# Patient Record
Sex: Female | Born: 1965 | Race: White | Hispanic: No | Marital: Married | Smoking: Never smoker
Health system: Southern US, Community
[De-identification: ages and names within clinical notes are randomized; demographics above are authoritative.]

## PROBLEM LIST (undated history)

## (undated) DIAGNOSIS — G43909 Migraine, unspecified, not intractable, without status migrainosus: Secondary | ICD-10-CM

## (undated) HISTORY — PX: TUBAL LIGATION: SHX77

## (undated) HISTORY — PX: BREAST EXCISIONAL BIOPSY: SUR124

## (undated) HISTORY — PX: BUNIONECTOMY: SHX129

---

## 1998-02-13 ENCOUNTER — Other Ambulatory Visit: Admission: RE | Admit: 1998-02-13 | Discharge: 1998-02-13 | Payer: Self-pay | Admitting: *Deleted

## 1998-10-12 ENCOUNTER — Other Ambulatory Visit: Admission: RE | Admit: 1998-10-12 | Discharge: 1998-10-12 | Payer: Self-pay | Admitting: *Deleted

## 1999-01-01 ENCOUNTER — Inpatient Hospital Stay (HOSPITAL_COMMUNITY): Admission: AD | Admit: 1999-01-01 | Discharge: 1999-01-25 | Payer: Self-pay | Admitting: Obstetrics and Gynecology

## 1999-01-02 ENCOUNTER — Encounter: Payer: Self-pay | Admitting: Obstetrics and Gynecology

## 1999-01-09 ENCOUNTER — Encounter: Payer: Self-pay | Admitting: Obstetrics and Gynecology

## 1999-01-14 ENCOUNTER — Encounter: Payer: Self-pay | Admitting: Obstetrics and Gynecology

## 1999-01-16 ENCOUNTER — Encounter: Payer: Self-pay | Admitting: Obstetrics and Gynecology

## 1999-01-26 ENCOUNTER — Encounter (HOSPITAL_COMMUNITY): Admission: RE | Admit: 1999-01-26 | Discharge: 1999-04-26 | Payer: Self-pay | Admitting: *Deleted

## 1999-03-13 ENCOUNTER — Other Ambulatory Visit: Admission: RE | Admit: 1999-03-13 | Discharge: 1999-03-13 | Payer: Self-pay | Admitting: *Deleted

## 1999-04-29 ENCOUNTER — Encounter (HOSPITAL_COMMUNITY): Admission: RE | Admit: 1999-04-29 | Discharge: 1999-07-28 | Payer: Self-pay | Admitting: *Deleted

## 1999-07-31 ENCOUNTER — Encounter: Admission: RE | Admit: 1999-07-31 | Discharge: 1999-10-29 | Payer: Self-pay | Admitting: *Deleted

## 1999-11-28 ENCOUNTER — Encounter: Admission: RE | Admit: 1999-11-28 | Discharge: 1999-12-27 | Payer: Self-pay | Admitting: *Deleted

## 2000-04-15 ENCOUNTER — Other Ambulatory Visit: Admission: RE | Admit: 2000-04-15 | Discharge: 2000-04-15 | Payer: Self-pay | Admitting: *Deleted

## 2001-05-04 ENCOUNTER — Other Ambulatory Visit: Admission: RE | Admit: 2001-05-04 | Discharge: 2001-05-04 | Payer: Self-pay | Admitting: *Deleted

## 2002-08-01 ENCOUNTER — Other Ambulatory Visit: Admission: RE | Admit: 2002-08-01 | Discharge: 2002-08-01 | Payer: Self-pay | Admitting: *Deleted

## 2003-10-05 ENCOUNTER — Other Ambulatory Visit: Admission: RE | Admit: 2003-10-05 | Discharge: 2003-10-05 | Payer: Self-pay | Admitting: *Deleted

## 2004-06-19 ENCOUNTER — Encounter: Admission: RE | Admit: 2004-06-19 | Discharge: 2004-06-19 | Payer: Self-pay | Admitting: Family Medicine

## 2004-06-26 ENCOUNTER — Encounter: Admission: RE | Admit: 2004-06-26 | Discharge: 2004-07-16 | Payer: Self-pay | Admitting: Family Medicine

## 2008-09-29 ENCOUNTER — Emergency Department (HOSPITAL_COMMUNITY): Admission: EM | Admit: 2008-09-29 | Discharge: 2008-09-29 | Payer: Self-pay | Admitting: Emergency Medicine

## 2008-10-31 ENCOUNTER — Ambulatory Visit: Payer: Self-pay | Admitting: Obstetrics and Gynecology

## 2008-10-31 ENCOUNTER — Inpatient Hospital Stay (HOSPITAL_COMMUNITY): Admission: AD | Admit: 2008-10-31 | Discharge: 2008-10-31 | Payer: Self-pay | Admitting: Obstetrics and Gynecology

## 2009-02-22 ENCOUNTER — Encounter (INDEPENDENT_AMBULATORY_CARE_PROVIDER_SITE_OTHER): Payer: Self-pay | Admitting: Obstetrics and Gynecology

## 2009-02-22 ENCOUNTER — Inpatient Hospital Stay (HOSPITAL_COMMUNITY): Admission: AD | Admit: 2009-02-22 | Discharge: 2009-02-24 | Payer: Self-pay | Admitting: Obstetrics and Gynecology

## 2010-10-12 LAB — CBC
HCT: 26.5 % — ABNORMAL LOW (ref 36.0–46.0)
HCT: 36.3 % (ref 36.0–46.0)
Hemoglobin: 8.4 g/dL — ABNORMAL LOW (ref 12.0–15.0)
Hemoglobin: 8.9 g/dL — ABNORMAL LOW (ref 12.0–15.0)
MCHC: 33.6 g/dL (ref 30.0–36.0)
MCV: 101.5 fL — ABNORMAL HIGH (ref 78.0–100.0)
Platelets: 125 10*3/uL — ABNORMAL LOW (ref 150–400)
RBC: 2.43 MIL/uL — ABNORMAL LOW (ref 3.87–5.11)
RBC: 2.61 MIL/uL — ABNORMAL LOW (ref 3.87–5.11)
RDW: 12.7 % (ref 11.5–15.5)
WBC: 10.3 10*3/uL (ref 4.0–10.5)
WBC: 12.1 10*3/uL — ABNORMAL HIGH (ref 4.0–10.5)
WBC: 14.4 10*3/uL — ABNORMAL HIGH (ref 4.0–10.5)

## 2010-10-16 LAB — CBC
HCT: 33.5 % — ABNORMAL LOW (ref 36.0–46.0)
Hemoglobin: 11.9 g/dL — ABNORMAL LOW (ref 12.0–15.0)
MCV: 95.1 fL (ref 78.0–100.0)
Platelets: 150 10*3/uL (ref 150–400)
RBC: 3.52 MIL/uL — ABNORMAL LOW (ref 3.87–5.11)
WBC: 10.3 10*3/uL (ref 4.0–10.5)

## 2010-10-16 LAB — WET PREP, GENITAL

## 2010-10-17 LAB — CBC
HCT: 40.5 % (ref 36.0–46.0)
Hemoglobin: 13.6 g/dL (ref 12.0–15.0)
MCHC: 33.6 g/dL (ref 30.0–36.0)
RBC: 4.37 MIL/uL (ref 3.87–5.11)

## 2010-10-17 LAB — DIFFERENTIAL
Basophils Relative: 2 % — ABNORMAL HIGH (ref 0–1)
Eosinophils Relative: 0 % (ref 0–5)
Lymphocytes Relative: 12 % (ref 12–46)
Monocytes Absolute: 0.4 10*3/uL (ref 0.1–1.0)
Monocytes Relative: 3 % (ref 3–12)
Neutro Abs: 11.1 10*3/uL — ABNORMAL HIGH (ref 1.7–7.7)

## 2010-10-17 LAB — URINALYSIS, ROUTINE W REFLEX MICROSCOPIC
Bilirubin Urine: NEGATIVE
Glucose, UA: NEGATIVE mg/dL
Hgb urine dipstick: NEGATIVE
Nitrite: NEGATIVE
Specific Gravity, Urine: 1.017 (ref 1.005–1.030)
pH: 7 (ref 5.0–8.0)

## 2010-11-19 NOTE — Op Note (Signed)
NAME:  Amanda Dudley, Amanda Dudley                  ACCOUNT NO.:  [id number]   MEDICAL RECORD NO.:  [id number]          PATIENT TYPE:  INP   LOCATION:  9117                          FACILITY:  [facility]   PHYSICIAN:  Duke Salvia. Marcelle Overlie, M.D.DATE OF BIRTH:  01-06-1966   DATE OF PROCEDURE:  02/22/2009  DATE OF DISCHARGE:                               OPERATIVE REPORT   PREOPERATIVE DIAGNOSES:  1. Term intrauterine pregnancy.  2. Previous cesarean section.  3. Occiput posterior presentation.  4. Failure to descend.  5. Request permanent sterilization.   POSTOPERATIVE DIAGNOSES:  1. Term intrauterine pregnancy.  2. Previous cesarean section.  3. Occiput posterior presentation.  4. Failure to descend.  5. Request permanent sterilization.   PROCEDURE:  Repeat low transverse cesarean section.   SURGEON:  Duke Salvia. Marcelle Overlie, MD   ANESTHESIA:  Spinal.   COMPLICATIONS:  None.   DRAINS:  Foley catheter.   ESTIMATED BLOOD LOSS:  1200 mL.   INDICATIONS:  This patient has had successful VBAC after cesarean  section years ago, low-transverse for CPD apparently due to persistent  OP presentation.  She achieved complete dilatation and despite pushing  for approximately an hour, had failure to descend, no regional  anesthesia and a lot of pain at that point and after discussion decided  to proceed with repeat cesarean section and tubal ligation.   The patient was taken to the operating room after an adequate level of  spinal anesthetic was obtained.  With the patient in left tilt position,  the abdomen prepped and draped in usual manner for sterile abdominal  procedure.  Foley catheter positioned draining clear urine.  Transverse  incision was made, we avoided her prior midline incision.  This was  carried down to the fascia which was incised and extended transversely.  Rectus muscle divided in midline.  Peritoneum entered superiorly without  incident and extended vertically.  The vesicouterine serosa was  incised  and the bladder was sharply and bluntly dissected below, bladder blade  repositioned.  Transverse incision made in lower segment, extended with  blunt dissection.  Clear fluid noted.  The fetus was presenting straight  OP.  She was then delivered of a female Apgars 9 and 9, pH 7.38, 6 pounds  9 ounces.  The infant suctioned, cord clamped and passed to pediatric  team for further care.  Placenta was then delivered manually intact, was  sent to pathology.  The uterus was firm, but too large to be easily  delivered through the incision, was left in the abdomen.  The cavity was  wiped clean with laparotomy pack.  Closure obtained with first layer of  0 chromic in a locked fashion followed by an imbricating layer of 0  chromic.  This was hemostatic.  The bladder flap area was intact and  hemostatic.  Tubes and ovaries were inspected and noted to be normal.  Filshie clip was then applied across each tube in the right angle, 2-3  cm from the cornu on each side with excellent application, these areas  were hemostatic.  Prior to closure, the sponge, needle,  and instrument  counts reported as correct x2.  Peritoneum closed with a 3-0 Vicryl  running suture.  The rectus muscles reapproximated in the midline with 3-  0 Vicryl interrupted sutures.  A 0 PDS used to close the fascia from  laterally to midline on either side.  Subcutaneous tissue was hemostatic  by 4-0 Monocryl subcuticular closure with pressure dressing.  Clear  urine noted.  During the case, she did receive Ancef 1 g IV as  dissection was getting started, had been on penicillin protocol during  labor for history of positive GBS.  Mother and baby doing well at that  point.      Richard M. Marcelle Overlie, M.D.  Electronically Signed     [facility]/MEDQ  D:  02/22/2009  T:  02/23/2009  Job:  [id number]

## 2010-11-22 NOTE — Discharge Summary (Signed)
[facility]  Patient:    Amanda Dudley                          MRN: [id number] Adm. Date:  [id number] Disc. Date: 01/25/99 Attending:  Trevor Iha Dictator:   Kathi Der, RN                           Discharge Summary  ADMISSION DIAGNOSES:          1. Intrauterine pregnancy at 21-1/[redacted] weeks gestation.                               2. Preterm labor with preterm cervical changes.  DISCHARGE DIAGNOSES:          1. Intrauterine pregnancy at 24+ weeks gestation.                               2. Spontaneous vaginal delivery of a viable female                                  infant with Apgars of 6 at one minute and 7 at                                  five minutes over an intact perineum.  PROCEDURE:                    On January 23, 1999, at 0417 a spontaneous vaginal delivery of a viable female infant with Apgars of 6 at one minute and 7 at five  minutes.  Baby to NICU.  REASON FOR ADMISSION:         The patient is a 45 year old, gravida 5, para 4, t approximately 21-1/[redacted] weeks gestation at the time of admission who was admitted ith bloody, mucousy discharge.  She had had some questionable preterm labor in the ast one to two weeks and was given Terbutaline p.o. to take as needed.  The patient  stated that she could not feel any contractions.  The patient was evaluated in triage and was found to have a bulging membranes with no contractions.  The patient does have a history of prior cesarean section and VBAC x 3.  Her estimated date of confinement is May 13, 1999, which has been confirmed by ultrasound.  HOSPITAL COURSE:              The patient was admitted and placed in Trendelenburg position.  She received magnesium sulfate and IV Unasyn for Group B Strep prophylaxis.  At the time of admission, the cervix was 1 to 2 cm, approximately 50% effaced, with a bulging bag.  An ultrasound done on June 28, showed no measurable cervix with  bulging membranes and therefore we were unable to schedule cerclage. The patient was maintained on magnesium sulfate and bed rest and did receive betamethasone during her hospitalization.  The patient remained stable and on July 11, the patient started having some increased uterine activity with bleeding. Magnesium sulfate was increased.  An ultrasound and CBC were ordered and on July 12, this did show an AFI of 7.1, no measurable cervix, and  a probable small marginal placental abruption.  Hemoglobin at this time was 11.1 and white blood  cell count 15.2.  The patient remained stable on magnesium and Terbutaline and Unasyn and on January 22, 1999, the patient once again had increased vaginal bleeding. Vaginal examination did show the cervix to be complete with the presenting part at +3.  The patient progressed and delivered a viable female infant over an intact  perineum.  This was a spontaneous vaginal delivery.  The baby had Apgars of 6 at one minute and 7 at five minutes and the baby was taken to NICU.  Postpartum, the patient does well.  On postpartum day #1, hemoglobin was 12.1, hematocrit 35.5, and white blood cell count 12.0.  The patient was discharged home on postpartum day #2 in good condition.  She is to follow up in the office in four to six weeks for er postpartum visit.  DISCHARGE MEDICATIONS:        1. Prenatal vitamins one p.o. q.d.                               2. Ibuprofen p.r.n.                               3. Vicodin #20 one to two p.o. q.6h. p.r.n.  DISCHARGE INSTRUCTIONS:       Per postpartum instruction booklet. DD:  04/11/99 TD:  04/11/99 Job: [id number]

## 2011-10-16 ENCOUNTER — Other Ambulatory Visit: Payer: Self-pay | Admitting: Obstetrics and Gynecology

## 2011-10-16 DIAGNOSIS — N6320 Unspecified lump in the left breast, unspecified quadrant: Secondary | ICD-10-CM

## 2011-10-21 ENCOUNTER — Ambulatory Visit
Admission: RE | Admit: 2011-10-21 | Discharge: 2011-10-21 | Disposition: A | Discharge status: Home / Self Care | Payer: BC Managed Care – PPO | Source: Ambulatory Visit | Attending: Obstetrics and Gynecology | Admitting: Obstetrics and Gynecology

## 2011-10-21 DIAGNOSIS — N6320 Unspecified lump in the left breast, unspecified quadrant: Secondary | ICD-10-CM

## 2012-12-21 ENCOUNTER — Emergency Department (HOSPITAL_BASED_OUTPATIENT_CLINIC_OR_DEPARTMENT_OTHER): Payer: Worker's Compensation

## 2012-12-21 ENCOUNTER — Emergency Department (HOSPITAL_BASED_OUTPATIENT_CLINIC_OR_DEPARTMENT_OTHER)
Admission: EM | Admit: 2012-12-21 | Discharge: 2012-12-21 | Disposition: A | Discharge status: Home / Self Care | Payer: Worker's Compensation | Attending: Emergency Medicine | Admitting: Emergency Medicine

## 2012-12-21 ENCOUNTER — Encounter (HOSPITAL_BASED_OUTPATIENT_CLINIC_OR_DEPARTMENT_OTHER): Payer: Self-pay

## 2012-12-21 DIAGNOSIS — IMO0002 Reserved for concepts with insufficient information to code with codable children: Secondary | ICD-10-CM | POA: Insufficient documentation

## 2012-12-21 DIAGNOSIS — S43401A Unspecified sprain of right shoulder joint, initial encounter: Secondary | ICD-10-CM

## 2012-12-21 DIAGNOSIS — Y929 Unspecified place or not applicable: Secondary | ICD-10-CM | POA: Insufficient documentation

## 2012-12-21 DIAGNOSIS — Y990 Civilian activity done for income or pay: Secondary | ICD-10-CM | POA: Insufficient documentation

## 2012-12-21 DIAGNOSIS — Y9389 Activity, other specified: Secondary | ICD-10-CM | POA: Insufficient documentation

## 2012-12-21 DIAGNOSIS — Z8679 Personal history of other diseases of the circulatory system: Secondary | ICD-10-CM | POA: Insufficient documentation

## 2012-12-21 DIAGNOSIS — W010XXA Fall on same level from slipping, tripping and stumbling without subsequent striking against object, initial encounter: Secondary | ICD-10-CM | POA: Insufficient documentation

## 2012-12-21 HISTORY — DX: Migraine, unspecified, not intractable, without status migrainosus: G43.909

## 2012-12-21 MED ORDER — NAPROXEN 500 MG PO TABS: 500.0000 mg | ORAL_TABLET | Freq: Two times a day (BID) | ORAL | Status: DC

## 2012-12-21 NOTE — ED Notes (Signed)
Tripped over desk at work 130pm-pain to right shoulder

## 2012-12-21 NOTE — ED Provider Notes (Signed)
History    CSN: [id number] Arrival date & time 12/21/12  1635 First MD Initiated Contact with Patient 12/21/12 1700      Chief Complaint  Patient presents with  . Shoulder Injury    HPI Patient presents emergent complaints of pain in her right shoulder. She was at work when she tripped over a desk and injured her right shoulder. Patient states the pain is mild to moderate. It increases with movement and palpation. She denies any other complaints of neck pain or low pain. She denies any numbness or weakness.  Past Medical History  Diagnosis Date  . Migraine     Past Surgical History  Procedure Laterality Date  . Bunionectomy    . Cesarean section    . Tubal ligation      No family history on file.  History  Substance Use Topics  . Smoking status: Never Smoker   . Smokeless tobacco: Not on file  . Alcohol Use: No    OB History   Grav Para Term Preterm Abortions TAB SAB Ect Mult Living                  Review of Systems  All other systems reviewed and are negative.    Allergies  Review of patient's allergies indicates no known allergies.  Home Medications   Current Outpatient Rx  Name  Route  Sig  Dispense  Refill  . naproxen (NAPROSYN) 500 MG tablet   Oral   Take 1 tablet (500 mg total) by mouth 2 (two) times daily.   30 tablet   0     BP 151/83  Pulse 86  Temp(Src) 98 F (36.7 C) (Oral)  Resp 18  Ht 5\' 3"  (1.6 m)  Wt 200 lb (90.719 kg)  BMI 35.44 kg/m2  SpO2 97%  LMP 12/11/2012  Physical Exam  Nursing note and vitals reviewed. Constitutional: She appears well-developed and well-nourished. No distress.  HENT:  Head: Normocephalic and atraumatic.  Right Ear: External ear normal.  Left Ear: External ear normal.  Eyes: Conjunctivae are normal. Right eye exhibits no discharge. Left eye exhibits no discharge. No scleral icterus.  Neck: Neck supple. No tracheal deviation present.  Cardiovascular: Normal rate.   Pulmonary/Chest: Effort normal.  No stridor. No respiratory distress.  Musculoskeletal: She exhibits no edema.       Right shoulder: She exhibits tenderness, bony tenderness and pain. She exhibits normal range of motion, no swelling, no deformity, normal pulse and normal strength.       Right elbow: Normal.      Right wrist: Normal.       Cervical back: Normal.  Neurological: She is alert. Cranial nerve deficit: no gross deficits.  Skin: Skin is warm and dry. No rash noted.  Psychiatric: She has a normal mood and affect.    ED Course  Procedures (including critical care time) Sling applied Labs Reviewed - No data to display Dg Shoulder Right  12/21/2012   *RADIOLOGY REPORT*  Clinical Data: Right shoulder pain, fall  RIGHT SHOULDER - 2+ VIEW  Comparison: None.  Findings: Glenohumeral joint is intact.  No evidence of scapular fracture  or humeral fracture.  The acromioclavicular joint is intact.  IMPRESSION: No fracture or dislocation.   Original Report Authenticated By: Genevive Bi, M.D.     1. Sprain shoulder/arm, right, initial encounter       MDM  At this time there does not appear to be any evidence of an acute emergency medical  condition and the patient appears stable for discharge with appropriate outpatient follow up.        Celene Kras, MD 12/21/12 [phone]

## 2016-01-15 ENCOUNTER — Other Ambulatory Visit: Payer: Self-pay | Admitting: Obstetrics and Gynecology

## 2016-01-15 DIAGNOSIS — Z1231 Encounter for screening mammogram for malignant neoplasm of breast: Secondary | ICD-10-CM

## 2016-01-24 ENCOUNTER — Ambulatory Visit
Admission: RE | Admit: 2016-01-24 | Discharge: 2016-01-24 | Disposition: A | Discharge status: Home / Self Care | Payer: BC Managed Care – PPO | Source: Ambulatory Visit | Attending: Obstetrics and Gynecology | Admitting: Obstetrics and Gynecology

## 2016-01-24 DIAGNOSIS — Z1231 Encounter for screening mammogram for malignant neoplasm of breast: Secondary | ICD-10-CM

## 2016-01-28 ENCOUNTER — Other Ambulatory Visit: Payer: Self-pay | Admitting: Obstetrics and Gynecology

## 2016-01-28 DIAGNOSIS — R928 Other abnormal and inconclusive findings on diagnostic imaging of breast: Secondary | ICD-10-CM

## 2016-02-04 ENCOUNTER — Other Ambulatory Visit: Payer: Self-pay | Admitting: Obstetrics and Gynecology

## 2016-02-04 ENCOUNTER — Ambulatory Visit
Admission: RE | Admit: 2016-02-04 | Discharge: 2016-02-04 | Disposition: A | Discharge status: Home / Self Care | Payer: BC Managed Care – PPO | Source: Ambulatory Visit | Attending: Obstetrics and Gynecology | Admitting: Obstetrics and Gynecology

## 2016-02-04 DIAGNOSIS — N6320 Unspecified lump in the left breast, unspecified quadrant: Secondary | ICD-10-CM

## 2016-02-04 DIAGNOSIS — R928 Other abnormal and inconclusive findings on diagnostic imaging of breast: Secondary | ICD-10-CM

## 2016-02-05 ENCOUNTER — Ambulatory Visit
Admission: RE | Admit: 2016-02-05 | Discharge: 2016-02-05 | Disposition: A | Discharge status: Home / Self Care | Payer: BC Managed Care – PPO | Source: Ambulatory Visit | Attending: Obstetrics and Gynecology | Admitting: Obstetrics and Gynecology

## 2016-02-05 ENCOUNTER — Ambulatory Visit
Admission: RE | Admit: 2016-02-05 | Discharge: 2016-02-05 | Disposition: A | Discharge status: Home / Self Care | Payer: Self-pay | Source: Ambulatory Visit | Attending: Obstetrics and Gynecology | Admitting: Obstetrics and Gynecology

## 2016-02-05 DIAGNOSIS — N6320 Unspecified lump in the left breast, unspecified quadrant: Secondary | ICD-10-CM

## 2016-12-30 ENCOUNTER — Other Ambulatory Visit: Payer: Self-pay | Admitting: Obstetrics and Gynecology

## 2016-12-30 DIAGNOSIS — Z1231 Encounter for screening mammogram for malignant neoplasm of breast: Secondary | ICD-10-CM

## 2017-02-09 ENCOUNTER — Encounter (INDEPENDENT_AMBULATORY_CARE_PROVIDER_SITE_OTHER): Payer: Self-pay

## 2017-02-09 ENCOUNTER — Ambulatory Visit
Admission: RE | Admit: 2017-02-09 | Discharge: 2017-02-09 | Disposition: A | Discharge status: Home / Self Care | Payer: BC Managed Care – PPO | Source: Ambulatory Visit | Attending: Obstetrics and Gynecology | Admitting: Obstetrics and Gynecology

## 2017-02-09 DIAGNOSIS — Z1231 Encounter for screening mammogram for malignant neoplasm of breast: Secondary | ICD-10-CM

## 2017-02-10 ENCOUNTER — Other Ambulatory Visit: Payer: Self-pay | Admitting: Obstetrics and Gynecology

## 2017-02-10 DIAGNOSIS — R928 Other abnormal and inconclusive findings on diagnostic imaging of breast: Secondary | ICD-10-CM

## 2017-02-16 ENCOUNTER — Ambulatory Visit
Admission: RE | Admit: 2017-02-16 | Discharge: 2017-02-16 | Disposition: A | Discharge status: Home / Self Care | Payer: BC Managed Care – PPO | Source: Ambulatory Visit | Attending: Obstetrics and Gynecology | Admitting: Obstetrics and Gynecology

## 2017-02-16 DIAGNOSIS — R928 Other abnormal and inconclusive findings on diagnostic imaging of breast: Secondary | ICD-10-CM

## 2018-05-17 ENCOUNTER — Other Ambulatory Visit: Payer: Self-pay | Admitting: Obstetrics and Gynecology

## 2018-05-17 DIAGNOSIS — Z1231 Encounter for screening mammogram for malignant neoplasm of breast: Secondary | ICD-10-CM

## 2018-06-28 ENCOUNTER — Ambulatory Visit
Admission: RE | Admit: 2018-06-28 | Discharge: 2018-06-28 | Disposition: A | Discharge status: Home / Self Care | Payer: BC Managed Care – PPO | Source: Ambulatory Visit | Attending: Obstetrics and Gynecology | Admitting: Obstetrics and Gynecology

## 2018-06-28 DIAGNOSIS — Z1231 Encounter for screening mammogram for malignant neoplasm of breast: Secondary | ICD-10-CM

## 2018-12-30 ENCOUNTER — Other Ambulatory Visit: Payer: Self-pay

## 2018-12-30 ENCOUNTER — Emergency Department (HOSPITAL_BASED_OUTPATIENT_CLINIC_OR_DEPARTMENT_OTHER): Payer: BC Managed Care – PPO

## 2018-12-30 ENCOUNTER — Observation Stay (HOSPITAL_BASED_OUTPATIENT_CLINIC_OR_DEPARTMENT_OTHER)
Admission: EM | Admit: 2018-12-30 | Discharge: 2019-01-01 | Disposition: A | Discharge status: Home / Self Care | Payer: BC Managed Care – PPO | Attending: General Surgery | Admitting: General Surgery

## 2018-12-30 ENCOUNTER — Encounter (HOSPITAL_BASED_OUTPATIENT_CLINIC_OR_DEPARTMENT_OTHER): Payer: Self-pay | Admitting: *Deleted

## 2018-12-30 DIAGNOSIS — K810 Acute cholecystitis: Secondary | ICD-10-CM | POA: Diagnosis present

## 2018-12-30 DIAGNOSIS — M5126 Other intervertebral disc displacement, lumbar region: Secondary | ICD-10-CM | POA: Insufficient documentation

## 2018-12-30 DIAGNOSIS — Z1159 Encounter for screening for other viral diseases: Secondary | ICD-10-CM | POA: Insufficient documentation

## 2018-12-30 DIAGNOSIS — K760 Fatty (change of) liver, not elsewhere classified: Secondary | ICD-10-CM | POA: Insufficient documentation

## 2018-12-30 DIAGNOSIS — Z79899 Other long term (current) drug therapy: Secondary | ICD-10-CM | POA: Insufficient documentation

## 2018-12-30 DIAGNOSIS — Z6839 Body mass index (BMI) 39.0-39.9, adult: Secondary | ICD-10-CM | POA: Diagnosis not present

## 2018-12-30 DIAGNOSIS — R112 Nausea with vomiting, unspecified: Secondary | ICD-10-CM

## 2018-12-30 DIAGNOSIS — K8010 Calculus of gallbladder with chronic cholecystitis without obstruction: Principal | ICD-10-CM | POA: Diagnosis present

## 2018-12-30 DIAGNOSIS — Z885 Allergy status to narcotic agent status: Secondary | ICD-10-CM | POA: Insufficient documentation

## 2018-12-30 DIAGNOSIS — K429 Umbilical hernia without obstruction or gangrene: Secondary | ICD-10-CM | POA: Diagnosis not present

## 2018-12-30 DIAGNOSIS — M48061 Spinal stenosis, lumbar region without neurogenic claudication: Secondary | ICD-10-CM | POA: Insufficient documentation

## 2018-12-30 LAB — COMPREHENSIVE METABOLIC PANEL
ALT: 37 U/L (ref 0–44)
AST: 31 U/L (ref 15–41)
Albumin: 4.3 g/dL (ref 3.5–5.0)
Alkaline Phosphatase: 59 U/L (ref 38–126)
Anion gap: 12 (ref 5–15)
BUN: 14 mg/dL (ref 6–20)
CO2: 25 mmol/L (ref 22–32)
Calcium: 9.4 mg/dL (ref 8.9–10.3)
Chloride: 103 mmol/L (ref 98–111)
Creatinine, Ser: 0.94 mg/dL (ref 0.44–1.00)
GFR calc Af Amer: >60 mL/min (ref >60)
GFR calc non Af Amer: >60 mL/min (ref >60)
Glucose, Bld: 90 mg/dL (ref 70–99)
Potassium: 3.6 mmol/L (ref 3.5–5.1)
Sodium: 140 mmol/L (ref 135–145)
Total Bilirubin: 0.7 mg/dL (ref 0.3–1.2)
Total Protein: 7.3 g/dL (ref 6.5–8.1)

## 2018-12-30 LAB — URINALYSIS, ROUTINE W REFLEX MICROSCOPIC
Bilirubin Urine: NEGATIVE
Glucose, UA: NEGATIVE mg/dL
Hgb urine dipstick: NEGATIVE
Ketones, ur: NEGATIVE mg/dL
Leukocytes,Ua: NEGATIVE
Nitrite: NEGATIVE
Protein, ur: NEGATIVE mg/dL
Specific Gravity, Urine: 1.01 (ref 1.005–1.030)
pH: 6 (ref 5.0–8.0)

## 2018-12-30 LAB — SURGICAL PCR SCREEN
MRSA, PCR: NEGATIVE
Staphylococcus aureus: NEGATIVE

## 2018-12-30 LAB — CBC WITH DIFFERENTIAL/PLATELET
Abs Immature Granulocytes: 0.01 10*3/uL (ref 0.00–0.07)
Basophils Absolute: 0 10*3/uL (ref 0.0–0.1)
Basophils Relative: 1 %
Eosinophils Absolute: 0.1 10*3/uL (ref 0.0–0.5)
Eosinophils Relative: 1 %
HCT: 43.5 % (ref 36.0–46.0)
Hemoglobin: 14.6 g/dL (ref 12.0–15.0)
Immature Granulocytes: 0 %
Lymphocytes Relative: 29 %
Lymphs Abs: 1.8 10*3/uL (ref 0.7–4.0)
MCH: 31.1 pg (ref 26.0–34.0)
MCHC: 33.6 g/dL (ref 30.0–36.0)
MCV: 92.6 fL (ref 80.0–100.0)
Monocytes Absolute: 0.4 10*3/uL (ref 0.1–1.0)
Monocytes Relative: 7 %
Neutro Abs: 3.8 10*3/uL (ref 1.7–7.7)
Neutrophils Relative %: 62 %
Platelets: 194 10*3/uL (ref 150–400)
RBC: 4.7 MIL/uL (ref 3.87–5.11)
RDW: 11.9 % (ref 11.5–15.5)
WBC: 6.1 10*3/uL (ref 4.0–10.5)
nRBC: 0 % (ref 0.0–0.2)

## 2018-12-30 LAB — SARS CORONAVIRUS 2 BY RT PCR (HOSPITAL ORDER, PERFORMED IN ~~LOC~~ HOSPITAL LAB): SARS Coronavirus 2: NEGATIVE

## 2018-12-30 LAB — LIPASE, BLOOD: Lipase: 33 U/L (ref 11–51)

## 2018-12-30 MED ORDER — SODIUM CHLORIDE 0.9 % IV BOLUS
500.0000 mL | Freq: Once | INTRAVENOUS | Status: AC
  Administered 2018-12-30: 13:00:00 500 mL via INTRAVENOUS

## 2018-12-30 MED ORDER — ONDANSETRON HCL 4 MG/2ML IJ SOLN
4.0000 mg | Freq: Four times a day (QID) | INTRAMUSCULAR | Status: DC | PRN
  Administered 2018-12-31 (×2): 4 mg via INTRAVENOUS
  Filled 2018-12-30 (×2): qty 2

## 2018-12-30 MED ORDER — SODIUM CHLORIDE 0.9 % IV SOLN
2.0000 g | INTRAVENOUS | Status: DC
  Administered 2018-12-30: 21:00:00 2 g via INTRAVENOUS
  Filled 2018-12-30: qty 2
  Filled 2018-12-30 (×2): qty 20

## 2018-12-30 MED ORDER — PROMETHAZINE HCL 25 MG/ML IJ SOLN: 12.5000 mg | Freq: Four times a day (QID) | INTRAMUSCULAR | Status: DC | PRN

## 2018-12-30 MED ORDER — MORPHINE SULFATE (PF) 2 MG/ML IV SOLN: 1.0000 mg | INTRAVENOUS | Status: DC | PRN

## 2018-12-30 MED ORDER — POTASSIUM CHLORIDE IN NACL 20-0.9 MEQ/L-% IV SOLN
INTRAVENOUS | Status: DC
  Administered 2018-12-30 – 2018-12-31 (×2): via INTRAVENOUS
  Filled 2018-12-30 (×3): qty 1000

## 2018-12-30 MED ORDER — ONDANSETRON 4 MG PO TBDP: 4.0000 mg | ORAL_TABLET | Freq: Four times a day (QID) | ORAL | Status: DC | PRN

## 2018-12-30 MED ORDER — IOHEXOL 350 MG/ML SOLN
100.0000 mL | Freq: Once | INTRAVENOUS | Status: AC | PRN
  Administered 2018-12-30: 14:00:00 100 mL via INTRAVENOUS

## 2018-12-30 MED ORDER — PANTOPRAZOLE SODIUM 40 MG IV SOLR
40.0000 mg | Freq: Every day | INTRAVENOUS | Status: DC
  Administered 2018-12-30: 21:00:00 40 mg via INTRAVENOUS
  Filled 2018-12-30: qty 40

## 2018-12-30 NOTE — ED Provider Notes (Signed)
Dr. Marlou Starks has seen the patient, and plans on admitting her for cholecystectomy.  Currently, she is comfortable and denies pain.   Daleen Bo, MD 12/30/18 [phone]

## 2018-12-30 NOTE — ED Notes (Signed)
Spoke to [facility] ER charge Nurse, Marzetta Board, RN made aware about pt transfer via private vehicle.

## 2018-12-30 NOTE — ED Triage Notes (Signed)
Vomiting x 2 weeks. Her MD told her to come here for evaluation.

## 2018-12-30 NOTE — ED Provider Notes (Signed)
Emergency Department Provider Note   I have reviewed the triage vital signs and the nursing notes.   HISTORY  Chief Complaint Emesis   HPI Amanda Dudley is a 53 y.o. female with PMH of migraine presents to the emergency department for evaluation of nausea, vomiting over the past 2 weeks.  Patient describes burning epigastric discomfort along with vomiting.  Patient denies any unintentional weight loss.  Symptoms are worse at night.  Denies fevers, chills, recent travel, sick contacts.  Patient states that the pain and vomiting seem to come up suddenly shortly after eating.  After vomiting she does feel somewhat better.    Past Medical History:  Diagnosis Date  . Migraine     There are no active problems to display for this patient.   Past Surgical History:  Procedure Laterality Date  . BREAST EXCISIONAL BIOPSY Left    benign  . BUNIONECTOMY    . CESAREAN SECTION    . TUBAL LIGATION      Allergies Patient has no known allergies.  No family history on file.  Social History Social History   Tobacco Use  . Smoking status: Never Smoker  . Smokeless tobacco: Never Used  Substance Use Topics  . Alcohol use: No  . Drug use: No    Review of Systems  Constitutional: No fever/chills Eyes: No visual changes. ENT: No sore throat. Cardiovascular: Denies chest pain. Respiratory: Denies shortness of breath. Gastrointestinal: Positive epigastric abdominal pain. Positive nausea and vomiting.  No diarrhea.  No constipation. Genitourinary: Negative for dysuria. Musculoskeletal: Negative for back pain. Skin: Negative for rash. Neurological: Negative for headaches, focal weakness or numbness.  10-point ROS otherwise negative.  ____________________________________________   PHYSICAL EXAM:  VITAL SIGNS: ED Triage Vitals  Enc Vitals Group     BP 12/30/18 1250 (!) 133/95     Pulse Rate 12/30/18 1250 89     Resp 12/30/18 1250 14     Temp 12/30/18 1250 98.1 F  (36.7 C)     Temp Source 12/30/18 1250 Oral     SpO2 12/30/18 1250 100 %     Weight 12/30/18 1247 227 lb 14.4 oz (103.4 kg)     Height 12/30/18 1247 5' 3.5" (1.613 m)   Constitutional: Alert and oriented. Well appearing and in no acute distress. Eyes: Conjunctivae are normal.  Head: Atraumatic. Nose: No congestion/rhinnorhea. Mouth/Throat: Mucous membranes are moist. Neck: No stridor.  Cardiovascular: Normal rate, regular rhythm. Good peripheral circulation. Grossly normal heart sounds.   Respiratory: Normal respiratory effort.  No retractions. Lungs CTAB. Gastrointestinal: Soft with epigastric and RUQ tenderness. No distention.  Musculoskeletal: No lower extremity tenderness nor edema.  Neurologic:  Normal speech and language.  Skin:  Skin is warm, dry and intact. No rash noted.  ____________________________________________   LABS (all labs ordered are listed, but only abnormal results are displayed)  Labs Reviewed  COMPREHENSIVE METABOLIC PANEL  LIPASE, BLOOD  CBC WITH DIFFERENTIAL/PLATELET  URINALYSIS, ROUTINE W REFLEX MICROSCOPIC   ____________________________________________  RADIOLOGY  Ct Abdomen Pelvis W Contrast  Result Date: 12/30/2018 CLINICAL DATA:  Abdominal pain with nausea and vomiting EXAM: CT ABDOMEN AND PELVIS WITH CONTRAST TECHNIQUE: Multidetector CT imaging of the abdomen and pelvis was performed using the standard protocol following bolus administration of intravenous contrast. CONTRAST:  127mL OMNIPAQUE IOHEXOL 350 MG/ML SOLN COMPARISON:  None. FINDINGS: Lower chest: Lung bases are clear. Hepatobiliary: There is hepatic steatosis. No focal liver lesions are evident. There is cholelithiasis. There is thickening of  the gallbladder wall with apparent pericholecystic fluid. There is no appreciable biliary duct dilatation. Pancreas: There is no pancreatic mass or inflammatory focus. Spleen: No splenic lesions are evident. Adrenals/Urinary Tract: Adrenals  bilaterally appear unremarkable. Kidneys bilaterally show no evident mass or hydronephrosis on either side. There is no evident renal or ureteral calculus on either side. Urinary bladder is midline with wall thickness within normal limits. Stomach/Bowel: There is no appreciable bowel wall or mesenteric thickening. No bowel obstruction is appreciable. Terminal ileum appears normal. There is no free air or portal venous air. Vascular/Lymphatic: There is no abdominal aortic aneurysm. No vascular lesions are evident. There is no adenopathy in the abdomen or pelvis. Reproductive: Uterus is anteverted. There is no evident pelvic mass. Tubal ligation clip is appreciated at the level of the left fallopian tube. A clip is noted inferior to the uterus on the left. No clip is noted in the right fallopian tube region. Other: Appendix appears normal. There is no abscess or ascites in the abdomen or pelvis. There is thinning of the midline rectus muscle. Musculoskeletal: There are no blastic or lytic bone lesions. There is spinal stenosis at L3-4 due to bony hypertrophy and disc protrusion. No intramuscular lesions are evident. IMPRESSION: 1. Apparent cholelithiasis with gallbladder wall thickening and pericholecystic fluid. Suspect acute cholecystitis. Advise correlation with ultrasound of the gallbladder to further evaluate in this regard. 2. There is a clip at the level of the left fallopian tube. There is a clip inferior to the uterus on the left; no clip is seen in the region of the right fallopian tube. Suspect right fallopian tube clip has become detached and migrated to the left inferiorly. 3. No bowel obstruction. No abscess in the abdomen or pelvis. The appendix appears normal. 4. No evident renal or ureteral calculus. No hydronephrosis. Urinary bladder wall thickness is normal. 5. Spinal stenosis at L3-4 due to bony hypertrophy and disc protrusion. 6.  Hepatic steatosis. Electronically Signed   By: Lowella Grip  III M.D.   On: 12/30/2018 14:41    ____________________________________________   PROCEDURES  Procedure(s) performed:   Procedures  None  ____________________________________________   INITIAL IMPRESSION / ASSESSMENT AND PLAN / ED COURSE  Pertinent labs & imaging results that were available during my care of the patient were reviewed by me and considered in my medical decision making (see chart for details).   Patient presents to the emergency department for evaluation of epigastric abdominal pain and vomiting.  Symptoms appear to come on suddenly after eating.  Tenderness in the epigastric region primarily with some right upper quadrant tenderness.  No clear Murphy sign.  Patient without significant pain at this time.  02:55 PM  Labs reviewed with no leukocytosis.  Normal LFTs.  Normal bilirubin.  Patient CT scan shows inflammation around the gallbladder with cholelithiasis, wall thickening, pericholecystic fluid.  Discussed the case with the surgery PA on-call.  He has reviewed the images with his attending and they recommend transfer to the [facility] emergency department for evaluation.  He does not have symptoms consistent with COVID-19.  Plan for POV transport.  Patient has not had any pain medication or other medications which would cause drowsiness or affect her ability to drive. Discussed presenting directly to the [facility]. Dr. Wilson Singer accepts the patient in ED-ED transfer.    ____________________________________________  FINAL CLINICAL IMPRESSION(S) / ED DIAGNOSES  Final diagnoses:  Acute cholecystitis  Non-intractable vomiting with nausea, unspecified vomiting type    MEDICATIONS GIVEN DURING THIS  VISIT:  Medications  sodium chloride 0.9 % bolus 500 mL (500 mLs Intravenous New Bag/Given 12/30/18 1318)  iohexol (OMNIPAQUE) 350 MG/ML injection 100 mL (100 mLs Intravenous Contrast Given 12/30/18 1405)    Note:  This document was prepared using Dragon voice recognition  software and may include unintentional dictation errors.  Nanda Quinton, MD Emergency Medicine    Jimmie Dattilio, [facility], MD 12/30/18 [phone]

## 2018-12-30 NOTE — ED Notes (Signed)
Transport at bedside  

## 2018-12-30 NOTE — ED Notes (Signed)
ED TO INPATIENT HANDOFF REPORT  Name/Age/Gender Amanda Dudley 53 y.o. female  Code Status   Home/SNF/Other Home  Chief Complaint VOMITING  Level of Care/Admitting Diagnosis ED Disposition    ED Disposition Condition [place]: [place] [100102]  Level of Care: Med-Surg [16]  Covid Evaluation: Screening Protocol (No Symptoms)  Diagnosis: Cholecystitis with cholelithiasis [631497]  Admitting Physician: Jovita Kussmaul [id number]  Attending Physician: CCS, MD [[id number]  PT Class (Do Not Modify): Observation [104]  PT Acc Code (Do Not Modify): Observation [10022]       Medical History Past Medical History:  Diagnosis Date  . Migraine     Allergies Allergies  Allergen Reactions  . Norco [Hydrocodone-Acetaminophen] Nausea And Vomiting    Makes pt feel loopy     IV Location/Drains/Wounds Patient Lines/Drains/Airways Status   Active Line/Drains/Airways    Name:   Placement date:   Placement time:   Site:   Days:   Peripheral IV 12/30/18 Right Antecubital   12/30/18    1317    Antecubital   less than 1          Labs/Imaging Results for orders placed or performed during the hospital encounter of 12/30/18 (from the past 48 hour(s))  Comprehensive metabolic panel     Status: None   Collection Time: 12/30/18  1:19 PM  Result Value Ref Range   Sodium 140 135 - 145 mmol/L   Potassium 3.6 3.5 - 5.1 mmol/L   Chloride 103 98 - 111 mmol/L   CO2 25 22 - 32 mmol/L   Glucose, Bld 90 70 - 99 mg/dL   BUN 14 6 - 20 mg/dL   Creatinine, Ser 0.94 0.44 - 1.00 mg/dL   Calcium 9.4 8.9 - 10.3 mg/dL   Total Protein 7.3 6.5 - 8.1 g/dL   Albumin 4.3 3.5 - 5.0 g/dL   AST 31 15 - 41 U/L   ALT 37 0 - 44 U/L   Alkaline Phosphatase 59 38 - 126 U/L   Total Bilirubin 0.7 0.3 - 1.2 mg/dL   GFR calc non Af Amer >60 >60 mL/min   GFR calc Af Amer >60 >60 mL/min   Anion gap 12 5 - 15    Comment: Performed at [facility], [address] [place], [place] [address]  Lipase, blood     Status: None   Collection Time: 12/30/18  1:19 PM  Result Value Ref Range   Lipase 33 11 - 51 U/L    Comment: Performed at [facility], [address] [place], [place] [address]  CBC with Differential     Status: None   Collection Time: 12/30/18  1:19 PM  Result Value Ref Range   WBC 6.1 4.0 - 10.5 K/uL   RBC 4.70 3.87 - 5.11 MIL/uL   Hemoglobin 14.6 12.0 - 15.0 g/dL   HCT 43.5 36.0 - 46.0 %   MCV 92.6 80.0 - 100.0 fL   MCH 31.1 26.0 - 34.0 pg   MCHC 33.6 30.0 - 36.0 g/dL   RDW 11.9 11.5 - 15.5 %   Platelets 194 150 - 400 K/uL   nRBC 0.0 0.0 - 0.2 %   Neutrophils Relative % 62 %   Neutro Abs 3.8 1.7 - 7.7 K/uL   Lymphocytes Relative 29 %   Lymphs Abs 1.8 0.7 - 4.0 K/uL   Monocytes Relative 7 %   Monocytes Absolute 0.4 0.1 -  1.0 K/uL   Eosinophils Relative 1 %   Eosinophils Absolute 0.1 0.0 - 0.5 K/uL   Basophils Relative 1 %   Basophils Absolute 0.0 0.0 - 0.1 K/uL   Immature Granulocytes 0 %   Abs Immature Granulocytes 0.01 0.00 - 0.07 K/uL    Comment: Performed at [facility], [address] [place], [place] [address]  Urinalysis, Routine w reflex microscopic     Status: None   Collection Time: 12/30/18  1:19 PM  Result Value Ref Range   Color, Urine YELLOW YELLOW   APPearance CLEAR CLEAR   Specific Gravity, Urine 1.010 1.005 - 1.030   pH 6.0 5.0 - 8.0   Glucose, UA NEGATIVE NEGATIVE mg/dL   Hgb urine dipstick NEGATIVE NEGATIVE   Bilirubin Urine NEGATIVE NEGATIVE   Ketones, ur NEGATIVE NEGATIVE mg/dL   Protein, ur NEGATIVE NEGATIVE mg/dL   Nitrite NEGATIVE NEGATIVE   Leukocytes,Ua NEGATIVE NEGATIVE    Comment: Microscopic not done on urines with negative protein, blood, leukocytes, nitrite, or glucose < 500 mg/dL. Performed at [facility], [address] [place], [place] [address]   SARS Coronavirus 2 (CEPHEID - Performed in [facility] hospital lab), Hosp Order     Status: None    Collection Time: 12/30/18  5:50 PM   Specimen: Nasopharyngeal Swab  Result Value Ref Range   SARS Coronavirus 2 NEGATIVE NEGATIVE    Comment: (NOTE) If result is NEGATIVE SARS-CoV-2 target nucleic acids are NOT DETECTED. The SARS-CoV-2 RNA is generally detectable in upper and lower  respiratory specimens during the acute phase of infection. The lowest  concentration of SARS-CoV-2 viral copies this assay can detect is 250  copies / mL. A negative result does not preclude SARS-CoV-2 infection  and should not be used as the sole basis for treatment or other  patient management decisions.  A negative result may occur with  improper specimen collection / handling, submission of specimen other  than nasopharyngeal swab, presence of viral mutation(s) within the  areas targeted by this assay, and inadequate number of viral copies  (<250 copies / mL). A negative result must be combined with clinical  observations, patient history, and epidemiological information. If result is POSITIVE SARS-CoV-2 target nucleic acids are DETECTED. The SARS-CoV-2 RNA is generally detectable in upper and lower  respiratory specimens dur ing the acute phase of infection.  Positive  results are indicative of active infection with SARS-CoV-2.  Clinical  correlation with patient history and other diagnostic information is  necessary to determine patient infection status.  Positive results do  not rule out bacterial infection or co-infection with other viruses. If result is PRESUMPTIVE POSTIVE SARS-CoV-2 nucleic acids MAY BE PRESENT.   A presumptive positive result was obtained on the submitted specimen  and confirmed on repeat testing.  While 2019 novel coronavirus  (SARS-CoV-2) nucleic acids may be present in the submitted sample  additional confirmatory testing may be necessary for epidemiological  and / or clinical management purposes  to differentiate between  SARS-CoV-2 and other Sarbecovirus currently known  to infect humans.  If clinically indicated additional testing with an alternate test  methodology [phone]) is advised. The SARS-CoV-2 RNA is generally  detectable in upper and lower respiratory sp ecimens during the acute  phase of infection. The expected result is Negative. Fact Sheet for Patients:  StrictlyIdeas.no Fact Sheet for Healthcare Providers: BankingDealers.co.za This test is not yet approved or cleared by the [place] FDA and has  been authorized for detection and/or diagnosis of SARS-CoV-2 by FDA under an Emergency Use Authorization (EUA).  This EUA will remain in effect (meaning this test can be used) for the duration of the COVID-19 declaration under Section 564(b)(1) of the Act, 21 U.S.C. section 360bbb-3(b)(1), unless the authorization is terminated or revoked sooner. Performed at [facility], [place] [address] [place], [place] [address]    Ct Abdomen Pelvis W Contrast  Result Date: 12/30/2018 CLINICAL DATA:  Abdominal pain with nausea and vomiting EXAM: CT ABDOMEN AND PELVIS WITH CONTRAST TECHNIQUE: Multidetector CT imaging of the abdomen and pelvis was performed using the standard protocol following bolus administration of intravenous contrast. CONTRAST:  134mL OMNIPAQUE IOHEXOL 350 MG/ML SOLN COMPARISON:  None. FINDINGS: Lower chest: Lung bases are clear. Hepatobiliary: There is hepatic steatosis. No focal liver lesions are evident. There is cholelithiasis. There is thickening of the gallbladder wall with apparent pericholecystic fluid. There is no appreciable biliary duct dilatation. Pancreas: There is no pancreatic mass or inflammatory focus. Spleen: No splenic lesions are evident. Adrenals/Urinary Tract: Adrenals bilaterally appear unremarkable. Kidneys bilaterally show no evident mass or hydronephrosis on either side. There is no evident renal or ureteral calculus on either side. Urinary bladder is  midline with wall thickness within normal limits. Stomach/Bowel: There is no appreciable bowel wall or mesenteric thickening. No bowel obstruction is appreciable. Terminal ileum appears normal. There is no free air or portal venous air. Vascular/Lymphatic: There is no abdominal aortic aneurysm. No vascular lesions are evident. There is no adenopathy in the abdomen or pelvis. Reproductive: Uterus is anteverted. There is no evident pelvic mass. Tubal ligation clip is appreciated at the level of the left fallopian tube. A clip is noted inferior to the uterus on the left. No clip is noted in the right fallopian tube region. Other: Appendix appears normal. There is no abscess or ascites in the abdomen or pelvis. There is thinning of the midline rectus muscle. Musculoskeletal: There are no blastic or lytic bone lesions. There is spinal stenosis at L3-4 due to bony hypertrophy and disc protrusion. No intramuscular lesions are evident. IMPRESSION: 1. Apparent cholelithiasis with gallbladder wall thickening and pericholecystic fluid. Suspect acute cholecystitis. Advise correlation with ultrasound of the gallbladder to further evaluate in this regard. 2. There is a clip at the level of the left fallopian tube. There is a clip inferior to the uterus on the left; no clip is seen in the region of the right fallopian tube. Suspect right fallopian tube clip has become detached and migrated to the left inferiorly. 3. No bowel obstruction. No abscess in the abdomen or pelvis. The appendix appears normal. 4. No evident renal or ureteral calculus. No hydronephrosis. Urinary bladder wall thickness is normal. 5. Spinal stenosis at L3-4 due to bony hypertrophy and disc protrusion. 6.  Hepatic steatosis. Electronically Signed   By: Lowella Grip III M.D.   On: 12/30/2018 14:41    Pending Labs Unresulted Labs (From admission, onward)    Start     Ordered   Signed and Held  HIV antibody (Routine Testing)  Once,   R     Signed and  Held          Vitals/Pain Today's Vitals   12/30/18 1247 12/30/18 1250 12/30/18 1522  BP:  (!) 133/95 (!) 163/104  Pulse:  89   Resp:  14 20  Temp:  98.1 F (36.7 C)   TempSrc:  Oral   SpO2:  100%   Weight:  103.4 kg    Height: 5' 3.5" (1.613 m)    PainSc: 0-No pain      Isolation Precautions No active isolations  Medications Medications  sodium chloride 0.9 % bolus 500 mL (0 mLs Intravenous Stopped 12/30/18 1519)  iohexol (OMNIPAQUE) 350 MG/ML injection 100 mL (100 mLs Intravenous Contrast Given 12/30/18 1405)    Mobility walks with person assist

## 2018-12-30 NOTE — ED Notes (Signed)
Bed: WA23 Expected date:  Expected time:  Means of arrival:  Comments: 

## 2018-12-30 NOTE — ED Notes (Signed)
Transport called to take pt to floor.  

## 2018-12-30 NOTE — ED Notes (Signed)
Pt left the department to [facility] ED  Via private vehicle with a secured right -AC  IV

## 2018-12-31 ENCOUNTER — Observation Stay (HOSPITAL_COMMUNITY): Payer: BC Managed Care – PPO | Admitting: Anesthesiology

## 2018-12-31 ENCOUNTER — Inpatient Hospital Stay (HOSPITAL_COMMUNITY): Admission: RE | Admit: 2018-12-31 | Payer: BC Managed Care – PPO | Source: Home / Self Care | Admitting: General Surgery

## 2018-12-31 ENCOUNTER — Encounter (HOSPITAL_COMMUNITY): Payer: Self-pay | Admitting: Certified Registered"

## 2018-12-31 ENCOUNTER — Encounter (HOSPITAL_COMMUNITY)
Admission: EM | Disposition: A | Discharge status: Home / Self Care | Payer: Self-pay | Source: Home / Self Care | Attending: Emergency Medicine

## 2018-12-31 ENCOUNTER — Observation Stay (HOSPITAL_COMMUNITY): Payer: BC Managed Care – PPO

## 2018-12-31 HISTORY — PX: CHOLECYSTECTOMY: SHX55

## 2018-12-31 LAB — HIV ANTIBODY (ROUTINE TESTING W REFLEX): HIV Screen 4th Generation wRfx: NONREACTIVE

## 2018-12-31 SURGERY — LAPAROSCOPIC CHOLECYSTECTOMY WITH INTRAOPERATIVE CHOLANGIOGRAM
Anesthesia: General

## 2018-12-31 MED ORDER — PROPOFOL 10 MG/ML IV BOLUS
INTRAVENOUS | Status: DC | PRN
  Administered 2018-12-31: 180 mg via INTRAVENOUS

## 2018-12-31 MED ORDER — SCOPOLAMINE 1 MG/3DAYS TD PT72
MEDICATED_PATCH | TRANSDERMAL | Status: AC
  Administered 2018-12-31: 14:00:00 1.5 mg via TRANSDERMAL
  Filled 2018-12-31: qty 1

## 2018-12-31 MED ORDER — IOHEXOL 300 MG/ML  SOLN
INTRAMUSCULAR | Status: DC | PRN
  Administered 2018-12-31: 50 mL via INTRAVENOUS

## 2018-12-31 MED ORDER — ACETAMINOPHEN 325 MG PO TABS
650.0000 mg | ORAL_TABLET | Freq: Four times a day (QID) | ORAL | Status: DC | PRN
  Administered 2019-01-01: 08:00:00 650 mg via ORAL
  Filled 2018-12-31: qty 2

## 2018-12-31 MED ORDER — BUPIVACAINE-EPINEPHRINE (PF) 0.25% -1:200000 IJ SOLN
INTRAMUSCULAR | Status: AC
  Filled 2018-12-31: qty 30

## 2018-12-31 MED ORDER — MIDAZOLAM HCL 5 MG/5ML IJ SOLN
INTRAMUSCULAR | Status: DC | PRN
  Administered 2018-12-31: 2 mg via INTRAVENOUS

## 2018-12-31 MED ORDER — SCOPOLAMINE 1 MG/3DAYS TD PT72
1.0000 | MEDICATED_PATCH | TRANSDERMAL | Status: DC
  Administered 2018-12-31: 14:00:00 1.5 mg via TRANSDERMAL

## 2018-12-31 MED ORDER — FENTANYL CITRATE (PF) 100 MCG/2ML IJ SOLN
INTRAMUSCULAR | Status: DC | PRN
  Administered 2018-12-31: 100 ug via INTRAVENOUS

## 2018-12-31 MED ORDER — LIDOCAINE 2% (20 MG/ML) 5 ML SYRINGE
INTRAMUSCULAR | Status: DC | PRN
  Administered 2018-12-31: 100 mg via INTRAVENOUS

## 2018-12-31 MED ORDER — ONDANSETRON HCL 4 MG/2ML IJ SOLN: 4.0000 mg | Freq: Four times a day (QID) | INTRAMUSCULAR | Status: DC | PRN

## 2018-12-31 MED ORDER — ROCURONIUM BROMIDE 10 MG/ML (PF) SYRINGE
PREFILLED_SYRINGE | INTRAVENOUS | Status: DC | PRN
  Administered 2018-12-31: 50 mg via INTRAVENOUS

## 2018-12-31 MED ORDER — CHLORHEXIDINE GLUCONATE CLOTH 2 % EX PADS
6.0000 | MEDICATED_PAD | Freq: Every day | CUTANEOUS | Status: DC
  Administered 2018-12-31: 06:00:00 6 via TOPICAL

## 2018-12-31 MED ORDER — SODIUM CHLORIDE 0.9 % IV SOLN
2.0000 g | Freq: Once | INTRAVENOUS | Status: AC
  Administered 2018-12-31: 2 g via INTRAVENOUS

## 2018-12-31 MED ORDER — SODIUM CHLORIDE 0.9 % IV SOLN: 2.0000 g | INTRAVENOUS | Status: DC

## 2018-12-31 MED ORDER — MORPHINE SULFATE (PF) 4 MG/ML IV SOLN
1.0000 mg | INTRAVENOUS | Status: DC | PRN
  Administered 2018-12-31: 18:00:00 2 mg via INTRAVENOUS
  Filled 2018-12-31: qty 1

## 2018-12-31 MED ORDER — LACTATED RINGERS IR SOLN
Status: DC | PRN
  Administered 2018-12-31: 1000 mL

## 2018-12-31 MED ORDER — 0.9 % SODIUM CHLORIDE (POUR BTL) OPTIME
TOPICAL | Status: DC | PRN
  Administered 2018-12-31: 1000 mL

## 2018-12-31 MED ORDER — BUPIVACAINE-EPINEPHRINE 0.25% -1:200000 IJ SOLN
INTRAMUSCULAR | Status: DC | PRN
  Administered 2018-12-31: 30 mL

## 2018-12-31 MED ORDER — METOCLOPRAMIDE HCL 5 MG/ML IJ SOLN
10.0000 mg | Freq: Once | INTRAMUSCULAR | Status: AC | PRN
  Administered 2018-12-31: 16:00:00 10 mg via INTRAVENOUS

## 2018-12-31 MED ORDER — ONDANSETRON HCL 4 MG/2ML IJ SOLN
INTRAMUSCULAR | Status: DC | PRN
  Administered 2018-12-31: 4 mg via INTRAVENOUS

## 2018-12-31 MED ORDER — MEPERIDINE HCL 50 MG/ML IJ SOLN: 6.2500 mg | INTRAMUSCULAR | Status: DC | PRN

## 2018-12-31 MED ORDER — LACTATED RINGERS IV SOLN
INTRAVENOUS | Status: DC
  Administered 2018-12-31 (×2): via INTRAVENOUS

## 2018-12-31 MED ORDER — TRAMADOL HCL 50 MG PO TABS
50.0000 mg | ORAL_TABLET | Freq: Four times a day (QID) | ORAL | Status: DC | PRN
  Administered 2018-12-31: 50 mg via ORAL
  Filled 2018-12-31: qty 1

## 2018-12-31 MED ORDER — FENTANYL CITRATE (PF) 100 MCG/2ML IJ SOLN
25.0000 ug | INTRAMUSCULAR | Status: DC | PRN
  Administered 2018-12-31 (×3): 50 ug via INTRAVENOUS

## 2018-12-31 MED ORDER — DEXAMETHASONE SODIUM PHOSPHATE 10 MG/ML IJ SOLN
INTRAMUSCULAR | Status: DC | PRN
  Administered 2018-12-31: 10 mg via INTRAVENOUS

## 2018-12-31 MED ORDER — SUGAMMADEX SODIUM 200 MG/2ML IV SOLN
INTRAVENOUS | Status: DC | PRN
  Administered 2018-12-31: 200 mg via INTRAVENOUS

## 2018-12-31 SURGICAL SUPPLY — 33 items
ADH SKN CLS APL DERMABOND .7 (GAUZE/BANDAGES/DRESSINGS) ×1
APL PRP STRL LF DISP 70% ISPRP (MISCELLANEOUS) ×1
APPLIER CLIP 5 13 M/L LIGAMAX5 (MISCELLANEOUS) ×3
APR CLP MED LRG 5 ANG JAW (MISCELLANEOUS) ×1
BAG SPEC RTRVL LRG 6X4 10 (ENDOMECHANICALS) ×1
CABLE HIGH FREQUENCY MONO STRZ (ELECTRODE) ×3 IMPLANT
CATH REDDICK CHOLANGI 4FR 50CM (CATHETERS) ×3 IMPLANT
CHLORAPREP W/TINT 26 (MISCELLANEOUS) ×3 IMPLANT
CLIP APPLIE 5 13 M/L LIGAMAX5 (MISCELLANEOUS) ×1 IMPLANT
COVER MAYO STAND STRL (DRAPES) ×3 IMPLANT
COVER WAND RF STERILE (DRAPES) IMPLANT
DECANTER SPIKE VIAL GLASS SM (MISCELLANEOUS) ×3 IMPLANT
DERMABOND ADVANCED (GAUZE/BANDAGES/DRESSINGS) ×2
DERMABOND ADVANCED .7 DNX12 (GAUZE/BANDAGES/DRESSINGS) ×1 IMPLANT
DRAPE C-ARM 42X120 X-RAY (DRAPES) ×3 IMPLANT
ELECT REM PT RETURN 15FT ADLT (MISCELLANEOUS) ×3 IMPLANT
GLOVE BIO SURGEON STRL SZ7.5 (GLOVE) ×3 IMPLANT
GOWN STRL REUS W/TWL XL LVL3 (GOWN DISPOSABLE) ×9 IMPLANT
HEMOSTAT SURGICEL 4X8 (HEMOSTASIS) IMPLANT
IV CATH 14GX2 1/4 (CATHETERS) ×3 IMPLANT
KIT BASIN OR (CUSTOM PROCEDURE TRAY) ×3 IMPLANT
KIT TURNOVER KIT A (KITS) ×2 IMPLANT
POUCH SPECIMEN RETRIEVAL 10MM (ENDOMECHANICALS) ×3 IMPLANT
SCISSORS LAP 5X35 DISP (ENDOMECHANICALS) ×3 IMPLANT
SET IRRIG TUBING LAPAROSCOPIC (IRRIGATION / IRRIGATOR) ×3 IMPLANT
SET TUBE SMOKE EVAC HIGH FLOW (TUBING) ×3 IMPLANT
SLEEVE XCEL OPT CAN 5 100 (ENDOMECHANICALS) ×6 IMPLANT
SUT MNCRL AB 4-0 PS2 18 (SUTURE) ×3 IMPLANT
TOWEL OR 17X26 10 PK STRL BLUE (TOWEL DISPOSABLE) ×3 IMPLANT
TOWEL OR NON WOVEN STRL DISP B (DISPOSABLE) ×3 IMPLANT
TRAY LAPAROSCOPIC (CUSTOM PROCEDURE TRAY) ×3 IMPLANT
TROCAR BLADELESS OPT 5 100 (ENDOMECHANICALS) ×3 IMPLANT
TROCAR XCEL BLUNT TIP 100MML (ENDOMECHANICALS) ×3 IMPLANT

## 2018-12-31 NOTE — Interval H&P Note (Signed)
History and Physical Interval Note:  12/31/2018 2:23 PM  Amanda Dudley  has presented today for surgery, with the diagnosis of Acute cholecystitis.  The various methods of treatment have been discussed with the patient and family. After consideration of risks, benefits and other options for treatment, the patient has consented to  Procedure(s): LAPAROSCOPIC CHOLECYSTECTOMY WITH INTRAOPERATIVE CHOLANGIOGRAM (N/A) as a surgical intervention.  The patient's history has been reviewed, patient examined, no change in status, stable for surgery.  I have reviewed the patient's chart and labs.  Questions were answered to the patient's satisfaction.     [name]

## 2018-12-31 NOTE — Op Note (Signed)
12/30/2018 - 12/31/2018  3:48 PM  PATIENT:  Amanda Dudley  53 y.o. female  PRE-OPERATIVE DIAGNOSIS:  Acute cholecystitis  POST-OPERATIVE DIAGNOSIS:  Acute cholecystitis with cholelithiasis, umbilical hernia  PROCEDURE:  Procedure(s): LAPAROSCOPIC CHOLECYSTECTOMY WITH INTRAOPERATIVE CHOLANGIOGRAM (N/A)  Umbilical hernia repair  SURGEON:  Surgeon(s) and Role:    Jovita Kussmaul, MD - Primary  PHYSICIAN ASSISTANT:   ASSISTANTS: none   ANESTHESIA:   local and general  EBL:  minimal   BLOOD ADMINISTERED:none  DRAINS: none   LOCAL MEDICATIONS USED:  MARCAINE     SPECIMEN:  Source of Specimen:  gallbladder  DISPOSITION OF SPECIMEN:  PATHOLOGY  COUNTS:  YES  TOURNIQUET:  * No tourniquets in log *  DICTATION: .Dragon Dictation     Procedure: After informed consent was obtained the patient was brought to the operating room and placed in the supine position on the operating room table. After adequate induction of general anesthesia the patient's abdomen was prepped with ChloraPrep allowed to dry and draped in usual sterile manner. An appropriate timeout was performed. The area below the umbilicus was infiltrated with quarter percent  Marcaine. A small incision was made with a 15 blade knife. The incision was carried down through the subcutaneous tissue bluntly with a hemostat and [employer] retractors. The linea alba was identified. The linea alba was incised with a 15 blade knife and each side was grasped with Coker clamps. The preperitoneal space was then probed with a hemostat until the peritoneum was opened and access was gained to the abdominal cavity.  There was a small umbilical hernia that we opened with the fascial opening. A 0 Vicryl pursestring stitch was placed in the fascia surrounding the opening. A Hassan cannula was then placed through the opening and anchored in place with the previously placed Vicryl purse string stitch. The abdomen was insufflated with carbon  dioxide without difficulty. A laparoscope was inserted through the [facility] cannula in the right upper quadrant was inspected. Next the epigastric region was infiltrated with % Marcaine. A small incision was made with a 15 blade knife. A 5 mm port was placed bluntly through this incision into the abdominal cavity under direct vision. Next 2 sites were chosen laterally on the right side of the abdomen for placement of 5 mm ports. Each of these areas was infiltrated with quarter percent Marcaine. Small stab incisions were made with a 15 blade knife. 5 mm ports were then placed bluntly through these incisions into the abdominal cavity under direct vision without difficulty. A blunt grasper was placed through the lateralmost 5 mm port and used to grasp the dome of the gallbladder and elevated anteriorly and superiorly. Another blunt grasper was placed through the other 5 mm port and used to retract the body and neck of the gallbladder. A dissector was placed through the epigastric port and using the electrocautery the peritoneal reflection at the gallbladder neck was opened. Blunt dissection was then carried out in this area until the gallbladder neck-cystic duct junction was readily identified and a good window was created. A single clip was placed on the gallbladder neck. A small  ductotomy was made just below the clip with laparoscopic scissors. A 14-gauge Angiocath was then placed through the anterior abdominal wall under direct vision. A Reddick cholangiogram catheter was then placed through the Angiocath and flushed. The catheter was then placed in the cystic duct and anchored in place with a clip. A cholangiogram was obtained that showed no filling defects  good emptying into the duodenum an adequate length on the cystic duct. The anchoring clip and catheters were then removed from the patient. 3 clips were placed proximally on the cystic duct and the duct was divided between the 2 sets of clips. Posterior to this  the cystic artery was identified and again dissected bluntly in a circumferential manner until a good window  was created. 2 clips were placed proximally and one distally on the artery and the artery was divided between the 2 sets of clips. Next a laparoscopic hook cautery device was used to separate the gallbladder from the liver bed. Prior to completely detaching the gallbladder from the liver bed the liver bed was inspected and several small bleeding points were coagulated with the electrocautery until the area was completely hemostatic. The gallbladder was then detached the rest of it from the liver bed without difficulty. A laparoscopic bag was inserted through the hassan port. The laparoscope was moved to the epigastric port. The gallbladder was placed within the bag and the bag was sealed.  The bag with the gallbladder was then removed with the [facility] cannula through the infraumbilical port without difficulty. The fascial defect was then closed with the previously placed Vicryl pursestring stitch as well as with another figure-of-eight 0 Vicryl stitch. This closed the umbilical hernia as well. The liver bed was inspected again and found to be hemostatic. The abdomen was irrigated with copious amounts of saline until the effluent was clear. The ports were then removed under direct vision without difficulty and were found to be hemostatic. The gas was allowed to escape. The skin incisions were all closed with interrupted 4-0 Monocryl subcuticular stitches. Dermabond dressings were applied. The patient tolerated the procedure well. At the end of the case all needle sponge and instrument counts were correct. The patient was then awakened and taken to recovery in stable condition  PLAN OF CARE: Admit for overnight observation  PATIENT DISPOSITION:  PACU - hemodynamically stable.   Delay start of Pharmacological VTE agent (>24hrs) due to surgical blood loss or risk of bleeding: no

## 2018-12-31 NOTE — Anesthesia Procedure Notes (Signed)
Procedure Name: Intubation Date/Time: 12/31/2018 2:49 PM Performed by: Louretta Tantillo D, CRNA Pre-anesthesia Checklist: Patient identified, Emergency Drugs available, Suction available and Patient being monitored Patient Re-evaluated:Patient Re-evaluated prior to induction Oxygen Delivery Method: Circle system utilized Preoxygenation: Pre-oxygenation with 100% oxygen Induction Type: IV induction Ventilation: Mask ventilation without difficulty Laryngoscope Size: Mac and 3 Grade View: Grade I Tube type: Oral Tube size: 7.5 mm Number of attempts: 1 Airway Equipment and Method: Stylet Placement Confirmation: ETT inserted through vocal cords under direct vision,  positive ETCO2 and breath sounds checked- equal and bilateral Secured at: 21 cm Tube secured with: Tape Dental Injury: Teeth and Oropharynx as per pre-operative assessment

## 2018-12-31 NOTE — Progress Notes (Signed)
Patient ID: Amanda Dudley, female   DOB: 11-25-1965, 53 y.o.   MRN: [id number]       Subjective: Patient mostly still having nausea more than pain, but still with pain in RUQ.  Objective: Vital signs in last 24 hours: Temp:  [98.1 F (36.7 C)-98.2 F (36.8 C)] 98.2 F (36.8 C) (06/26 0533) Pulse Rate:  [72-89] 72 (06/26 0533) Resp:  [14-20] 16 (06/26 0533) BP: (132-163)/(83-104) 137/83 (06/26 0533) SpO2:  [96 %-100 %] 97 % (06/26 0533) Weight:  [103.4 kg] 103.4 kg (06/25 1247) Last BM Date: 12/29/18  Intake/Output from previous day: 06/25 0701 - 06/26 0700 In: 910.1 [I.V.:810.1; IV Piggyback:100] Out: 350 [Urine:350] Intake/Output this shift: No intake/output data recorded.  PE: Heart: regular Lungs: CTAB Abd: soft, tender in RUQ, ND, +BS  Lab Results:  Recent Labs    12/30/18 1319  WBC 6.1  HGB 14.6  HCT 43.5  PLT 194   BMET Recent Labs    12/30/18 1319  NA 140  K 3.6  CL 103  CO2 25  GLUCOSE 90  BUN 14  CREATININE 0.94  CALCIUM 9.4   PT/INR No results for input(s): LABPROT, INR in the last 72 hours. CMP     Component Value Date/Time   NA 140 12/30/2018 1319   K 3.6 12/30/2018 1319   CL 103 12/30/2018 1319   CO2 25 12/30/2018 1319   GLUCOSE 90 12/30/2018 1319   BUN 14 12/30/2018 1319   CREATININE 0.94 12/30/2018 1319   CALCIUM 9.4 12/30/2018 1319   PROT 7.3 12/30/2018 1319   ALBUMIN 4.3 12/30/2018 1319   AST 31 12/30/2018 1319   ALT 37 12/30/2018 1319   ALKPHOS 59 12/30/2018 1319   BILITOT 0.7 12/30/2018 1319   GFRNONAA >60 12/30/2018 1319   GFRAA >60 12/30/2018 1319   Lipase     Component Value Date/Time   LIPASE 33 12/30/2018 1319       Studies/Results: Ct Abdomen Pelvis W Contrast  Result Date: 12/30/2018 CLINICAL DATA:  Abdominal pain with nausea and vomiting EXAM: CT ABDOMEN AND PELVIS WITH CONTRAST TECHNIQUE: Multidetector CT imaging of the abdomen and pelvis was performed using the standard protocol following bolus  administration of intravenous contrast. CONTRAST:  169mL OMNIPAQUE IOHEXOL 350 MG/ML SOLN COMPARISON:  None. FINDINGS: Lower chest: Lung bases are clear. Hepatobiliary: There is hepatic steatosis. No focal liver lesions are evident. There is cholelithiasis. There is thickening of the gallbladder wall with apparent pericholecystic fluid. There is no appreciable biliary duct dilatation. Pancreas: There is no pancreatic mass or inflammatory focus. Spleen: No splenic lesions are evident. Adrenals/Urinary Tract: Adrenals bilaterally appear unremarkable. Kidneys bilaterally show no evident mass or hydronephrosis on either side. There is no evident renal or ureteral calculus on either side. Urinary bladder is midline with wall thickness within normal limits. Stomach/Bowel: There is no appreciable bowel wall or mesenteric thickening. No bowel obstruction is appreciable. Terminal ileum appears normal. There is no free air or portal venous air. Vascular/Lymphatic: There is no abdominal aortic aneurysm. No vascular lesions are evident. There is no adenopathy in the abdomen or pelvis. Reproductive: Uterus is anteverted. There is no evident pelvic mass. Tubal ligation clip is appreciated at the level of the left fallopian tube. A clip is noted inferior to the uterus on the left. No clip is noted in the right fallopian tube region. Other: Appendix appears normal. There is no abscess or ascites in the abdomen or pelvis. There is thinning of the midline rectus  muscle. Musculoskeletal: There are no blastic or lytic bone lesions. There is spinal stenosis at L3-4 due to bony hypertrophy and disc protrusion. No intramuscular lesions are evident. IMPRESSION: 1. Apparent cholelithiasis with gallbladder wall thickening and pericholecystic fluid. Suspect acute cholecystitis. Advise correlation with ultrasound of the gallbladder to further evaluate in this regard. 2. There is a clip at the level of the left fallopian tube. There is a clip  inferior to the uterus on the left; no clip is seen in the region of the right fallopian tube. Suspect right fallopian tube clip has become detached and migrated to the left inferiorly. 3. No bowel obstruction. No abscess in the abdomen or pelvis. The appendix appears normal. 4. No evident renal or ureteral calculus. No hydronephrosis. Urinary bladder wall thickness is normal. 5. Spinal stenosis at L3-4 due to bony hypertrophy and disc protrusion. 6.  Hepatic steatosis. Electronically Signed   By: Lowella Grip III M.D.   On: 12/30/2018 14:41    Anti-infectives: Anti-infectives (From admission, onward)   Start     Dose/Rate Route Frequency Ordered Stop   12/30/18 2100  cefTRIAXone (ROCEPHIN) 2 g in sodium chloride 0.9 % 100 mL IVPB     2 g 200 mL/hr over 30 Minutes Intravenous Every 24 hours 12/30/18 2009         Assessment/Plan Cholecystitis -plan for lap chole with possible IOC today -remain NPO for procedure -if unable to do today will be done tomorrow.  Discussed this possibility with the patient given emergency cases.   FEN - NPO  VTE - SCDs ID - [email or web address]   LOS: 0 days    Henreitta Cea , [facility] Surgery 12/31/2018, 9:52 AM Pager: [phone]

## 2018-12-31 NOTE — Anesthesia Postprocedure Evaluation (Signed)
Anesthesia Post Note  Patient: Amanda Dudley  Procedure(s) Performed: LAPAROSCOPIC CHOLECYSTECTOMY WITH INTRAOPERATIVE CHOLANGIOGRAM (N/A )     Patient location during evaluation: PACU Anesthesia Type: General Level of consciousness: awake and alert and oriented Pain management: pain level controlled Vital Signs Assessment: post-procedure vital signs reviewed and stable Respiratory status: spontaneous breathing, nonlabored ventilation and respiratory function stable Cardiovascular status: blood pressure returned to baseline and stable Postop Assessment: no apparent nausea or vomiting Anesthetic complications: no    Last Vitals:  Vitals:   12/31/18 1620 12/31/18 1630  BP:  136/75  Pulse: 72 83  Resp: 12 17  Temp:    SpO2: 98% 99%    Last Pain:  Vitals:   12/31/18 1630  TempSrc:   PainSc: 5                  Davan Nawabi A.

## 2018-12-31 NOTE — Anesthesia Preprocedure Evaluation (Signed)
Anesthesia Evaluation  Patient identified by MRN, date of birth, ID band Patient awake    Reviewed: Allergy & Precautions, NPO status , Patient's Chart, lab work & pertinent test results  Airway Mallampati: III  TM Distance: >3 FB Neck ROM: Full    Dental no notable dental hx. (+) Teeth Intact, Caps   Pulmonary neg pulmonary ROS,    Pulmonary exam normal breath sounds clear to auscultation       Cardiovascular negative cardio ROS Normal cardiovascular exam Rhythm:Regular Rate:Normal     Neuro/Psych  Headaches, negative psych ROS   GI/Hepatic Neg liver ROS, Cholelithiasis with acute cholecystitis   Endo/Other  Morbid obesity  Renal/GU negative Renal ROS  negative genitourinary   Musculoskeletal negative musculoskeletal ROS (+)   Abdominal (+) + obese,   Peds  Hematology negative hematology ROS (+)   Anesthesia Other Findings   Reproductive/Obstetrics                             Anesthesia Physical Anesthesia Plan  ASA: III  Anesthesia Plan: General   Post-op Pain Management:    Induction: Intravenous, Rapid sequence and Cricoid pressure planned  PONV Risk Score and Plan: Scopolamine patch - Pre-op, Midazolam, Ondansetron, Treatment may vary due to age or medical condition and Dexamethasone  Airway Management Planned: Oral ETT  Additional Equipment:   Intra-op Plan:   Post-operative Plan: Extubation in OR  Informed Consent: I have reviewed the patients History and Physical, chart, labs and discussed the procedure including the risks, benefits and alternatives for the proposed anesthesia with the patient or authorized representative who has indicated his/her understanding and acceptance.     Dental advisory given  Plan Discussed with: CRNA and Surgeon  Anesthesia Plan Comments:         Anesthesia Quick Evaluation

## 2018-12-31 NOTE — Transfer of Care (Signed)
Immediate Anesthesia Transfer of Care Note  Patient: Amanda Dudley  Procedure(s) Performed: LAPAROSCOPIC CHOLECYSTECTOMY WITH INTRAOPERATIVE CHOLANGIOGRAM (N/A )  Patient Location: PACU  Anesthesia Type:General  Level of Consciousness: awake, alert  and oriented  Airway & Oxygen Therapy: Patient Spontanous Breathing and Patient connected to nasal cannula oxygen  Post-op Assessment: Report given to RN and Post -op Vital signs reviewed and stable  Post vital signs: Reviewed and stable  Last Vitals:  Vitals Value Taken Time  BP    Temp    Pulse 75 12/31/18 1558  Resp 14 12/31/18 1558  SpO2 100 % 12/31/18 1558  Vitals shown include unvalidated device data.  Last Pain:  Vitals:   12/31/18 1417  TempSrc: Axillary  PainSc:       Patients Stated Pain Goal: 2 (11/91/47 8295)  Complications: No apparent anesthesia complications

## 2018-12-31 NOTE — H&P (View-Only) (Signed)
Patient ID: Amanda Dudley, female   DOB: [birth year], 53 y.o.   MRN: [id number]       Subjective: Patient mostly still having nausea more than pain, but still with pain in RUQ.  Objective: Vital signs in last 24 hours: Temp:  [98.1 F (36.7 C)-98.2 F (36.8 C)] 98.2 F (36.8 C) (06/26 0533) Pulse Rate:  [72-89] 72 (06/26 0533) Resp:  [14-20] 16 (06/26 0533) BP: (132-163)/(83-104) 137/83 (06/26 0533) SpO2:  [96 %-100 %] 97 % (06/26 0533) Weight:  [103.4 kg] 103.4 kg (06/25 1247) Last BM Date: 12/29/18  Intake/Output from previous day: 06/25 0701 - 06/26 0700 In: 910.1 [I.V.:810.1; IV Piggyback:100] Out: 350 [Urine:350] Intake/Output this shift: No intake/output data recorded.  PE: Heart: regular Lungs: CTAB Abd: soft, tender in RUQ, ND, +BS  Lab Results:  Recent Labs    12/30/18 1319  WBC 6.1  HGB 14.6  HCT 43.5  PLT 194   BMET Recent Labs    12/30/18 1319  NA 140  K 3.6  CL 103  CO2 25  GLUCOSE 90  BUN 14  CREATININE 0.94  CALCIUM 9.4   PT/INR No results for input(s): LABPROT, INR in the last 72 hours. CMP     Component Value Date/Time   NA 140 12/30/2018 1319   K 3.6 12/30/2018 1319   CL 103 12/30/2018 1319   CO2 25 12/30/2018 1319   GLUCOSE 90 12/30/2018 1319   BUN 14 12/30/2018 1319   CREATININE 0.94 12/30/2018 1319   CALCIUM 9.4 12/30/2018 1319   PROT 7.3 12/30/2018 1319   ALBUMIN 4.3 12/30/2018 1319   AST 31 12/30/2018 1319   ALT 37 12/30/2018 1319   ALKPHOS 59 12/30/2018 1319   BILITOT 0.7 12/30/2018 1319   GFRNONAA >60 12/30/2018 1319   GFRAA >60 12/30/2018 1319   Lipase     Component Value Date/Time   LIPASE 33 12/30/2018 1319       Studies/Results: Ct Abdomen Pelvis W Contrast  Result Date: 12/30/2018 CLINICAL DATA:  Abdominal pain with nausea and vomiting EXAM: CT ABDOMEN AND PELVIS WITH CONTRAST TECHNIQUE: Multidetector CT imaging of the abdomen and pelvis was performed using the standard protocol following bolus  administration of intravenous contrast. CONTRAST:  152mL OMNIPAQUE IOHEXOL 350 MG/ML SOLN COMPARISON:  None. FINDINGS: Lower chest: Lung bases are clear. Hepatobiliary: There is hepatic steatosis. No focal liver lesions are evident. There is cholelithiasis. There is thickening of the gallbladder wall with apparent pericholecystic fluid. There is no appreciable biliary duct dilatation. Pancreas: There is no pancreatic mass or inflammatory focus. Spleen: No splenic lesions are evident. Adrenals/Urinary Tract: Adrenals bilaterally appear unremarkable. Kidneys bilaterally show no evident mass or hydronephrosis on either side. There is no evident renal or ureteral calculus on either side. Urinary bladder is midline with wall thickness within normal limits. Stomach/Bowel: There is no appreciable bowel wall or mesenteric thickening. No bowel obstruction is appreciable. Terminal ileum appears normal. There is no free air or portal venous air. Vascular/Lymphatic: There is no abdominal aortic aneurysm. No vascular lesions are evident. There is no adenopathy in the abdomen or pelvis. Reproductive: Uterus is anteverted. There is no evident pelvic mass. Tubal ligation clip is appreciated at the level of the left fallopian tube. A clip is noted inferior to the uterus on the left. No clip is noted in the right fallopian tube region. Other: Appendix appears normal. There is no abscess or ascites in the abdomen or pelvis. There is thinning of the midline rectus  muscle. Musculoskeletal: There are no blastic or lytic bone lesions. There is spinal stenosis at L3-4 due to bony hypertrophy and disc protrusion. No intramuscular lesions are evident. IMPRESSION: 1. Apparent cholelithiasis with gallbladder wall thickening and pericholecystic fluid. Suspect acute cholecystitis. Advise correlation with ultrasound of the gallbladder to further evaluate in this regard. 2. There is a clip at the level of the left fallopian tube. There is a clip  inferior to the uterus on the left; no clip is seen in the region of the right fallopian tube. Suspect right fallopian tube clip has become detached and migrated to the left inferiorly. 3. No bowel obstruction. No abscess in the abdomen or pelvis. The appendix appears normal. 4. No evident renal or ureteral calculus. No hydronephrosis. Urinary bladder wall thickness is normal. 5. Spinal stenosis at L3-4 due to bony hypertrophy and disc protrusion. 6.  Hepatic steatosis. Electronically Signed   By: Lowella Grip III M.D.   On: 12/30/2018 14:41    Anti-infectives: Anti-infectives (From admission, onward)   Start     Dose/Rate Route Frequency Ordered Stop   12/30/18 2100  cefTRIAXone (ROCEPHIN) 2 g in sodium chloride 0.9 % 100 mL IVPB     2 g 200 mL/hr over 30 Minutes Intravenous Every 24 hours 12/30/18 2009         Assessment/Plan Cholecystitis -plan for lap chole with possible IOC today -remain NPO for procedure -if unable to do today will be done tomorrow.  Discussed this possibility with the patient given emergency cases.   FEN - NPO  VTE - SCDs ID - [email or web address]   LOS: 0 days    Henreitta Cea , [facility] 12/31/2018, 9:52 AM Pager: [phone]

## 2018-12-31 NOTE — H&P (Addendum)
Amanda Dudley is an 53 y.o. female.   Chief Complaint: abd pain HPI: The patient is a 53 year old white female who presents with upper abd pain for the last 2 weeks. She has pain every time she eats. She has nausea and vomiting associated with it. She denies fever. CT shows gallstones with gb wall thickening. lft's are normal  Past Medical History:  Diagnosis Date  . Migraine     Past Surgical History:  Procedure Laterality Date  . BREAST EXCISIONAL BIOPSY Left    benign  . BUNIONECTOMY    . CESAREAN SECTION    . TUBAL LIGATION      No family history on file. Social History:  reports that she has never smoked. She has never used smokeless tobacco. She reports that she does not drink alcohol or use drugs.  Allergies:  Allergies  Allergen Reactions  . Norco [Hydrocodone-Acetaminophen] Nausea And Vomiting    Makes pt feel loopy     Medications Prior to Admission  Medication Sig Dispense Refill  . Calcium Carbonate (CALCARB 600 PO) Take 600 mg by mouth daily.    . calcium carbonate (TUMS - DOSED IN MG ELEMENTAL CALCIUM) 500 MG chewable tablet Chew 2 tablets by mouth daily as needed for indigestion or heartburn.    . cholecalciferol (VITAMIN D3) 25 MCG (1000 UT) tablet Take 1,000 Units by mouth daily.    . Multiple Vitamins-Minerals (MULTIVITAMIN WITH MINERALS) tablet Take 1 tablet by mouth daily.    . naproxen sodium (ALEVE) 220 MG tablet Take 220-440 mg by mouth daily as needed (migraines).    . VYZULTA 0.024 % SOLN Place 1 drop into both eyes at bedtime.      Results for orders placed or performed during the hospital encounter of 12/30/18 (from the past 48 hour(s))  Comprehensive metabolic panel     Status: None   Collection Time: 12/30/18  1:19 PM  Result Value Ref Range   Sodium 140 135 - 145 mmol/L   Potassium 3.6 3.5 - 5.1 mmol/L   Chloride 103 98 - 111 mmol/L   CO2 25 22 - 32 mmol/L   Glucose, Bld 90 70 - 99 mg/dL   BUN 14 6 - 20 mg/dL   Creatinine, Ser 0.94 0.44  - 1.00 mg/dL   Calcium 9.4 8.9 - 10.3 mg/dL   Total Protein 7.3 6.5 - 8.1 g/dL   Albumin 4.3 3.5 - 5.0 g/dL   AST 31 15 - 41 U/L   ALT 37 0 - 44 U/L   Alkaline Phosphatase 59 38 - 126 U/L   Total Bilirubin 0.7 0.3 - 1.2 mg/dL   GFR calc non Af Amer >60 >60 mL/min   GFR calc Af Amer >60 >60 mL/min   Anion gap 12 5 - 15    Comment: Performed at [facility], [address] [place], [place] [address]  Lipase, blood     Status: None   Collection Time: 12/30/18  1:19 PM  Result Value Ref Range   Lipase 33 11 - 51 U/L    Comment: Performed at [facility], [address] [place], [place] [address]  CBC with Differential     Status: None   Collection Time: 12/30/18  1:19 PM  Result Value Ref Range   WBC 6.1 4.0 - 10.5 K/uL   RBC 4.70 3.87 - 5.11 MIL/uL   Hemoglobin 14.6 12.0 - 15.0 g/dL   HCT 43.5 36.0 - 46.0 %  MCV 92.6 80.0 - 100.0 fL   MCH 31.1 26.0 - 34.0 pg   MCHC 33.6 30.0 - 36.0 g/dL   RDW 11.9 11.5 - 15.5 %   Platelets 194 150 - 400 K/uL   nRBC 0.0 0.0 - 0.2 %   Neutrophils Relative % 62 %   Neutro Abs 3.8 1.7 - 7.7 K/uL   Lymphocytes Relative 29 %   Lymphs Abs 1.8 0.7 - 4.0 K/uL   Monocytes Relative 7 %   Monocytes Absolute 0.4 0.1 - 1.0 K/uL   Eosinophils Relative 1 %   Eosinophils Absolute 0.1 0.0 - 0.5 K/uL   Basophils Relative 1 %   Basophils Absolute 0.0 0.0 - 0.1 K/uL   Immature Granulocytes 0 %   Abs Immature Granulocytes 0.01 0.00 - 0.07 K/uL    Comment: Performed at [facility], [address] [place], [place] [address]  Urinalysis, Routine w reflex microscopic     Status: None   Collection Time: 12/30/18  1:19 PM  Result Value Ref Range   Color, Urine YELLOW YELLOW   APPearance CLEAR CLEAR   Specific Gravity, Urine 1.010 1.005 - 1.030   pH 6.0 5.0 - 8.0   Glucose, UA NEGATIVE NEGATIVE mg/dL   Hgb urine dipstick NEGATIVE NEGATIVE   Bilirubin Urine NEGATIVE NEGATIVE   Ketones, ur NEGATIVE NEGATIVE mg/dL   Protein,  ur NEGATIVE NEGATIVE mg/dL   Nitrite NEGATIVE NEGATIVE   Leukocytes,Ua NEGATIVE NEGATIVE    Comment: Microscopic not done on urines with negative protein, blood, leukocytes, nitrite, or glucose < 500 mg/dL. Performed at [facility], [address] [place], [place] [address]   SARS Coronavirus 2 (CEPHEID - Performed in [facility] hospital lab), Hosp Order     Status: None   Collection Time: 12/30/18  5:50 PM   Specimen: Nasopharyngeal Swab  Result Value Ref Range   SARS Coronavirus 2 NEGATIVE NEGATIVE    Comment: (NOTE) If result is NEGATIVE SARS-CoV-2 target nucleic acids are NOT DETECTED. The SARS-CoV-2 RNA is generally detectable in upper and lower  respiratory specimens during the acute phase of infection. The lowest  concentration of SARS-CoV-2 viral copies this assay can detect is 250  copies / mL. A negative result does not preclude SARS-CoV-2 infection  and should not be used as the sole basis for treatment or other  patient management decisions.  A negative result may occur with  improper specimen collection / handling, submission of specimen other  than nasopharyngeal swab, presence of viral mutation(s) within the  areas targeted by this assay, and inadequate number of viral copies  (<250 copies / mL). A negative result must be combined with clinical  observations, patient history, and epidemiological information. If result is POSITIVE SARS-CoV-2 target nucleic acids are DETECTED. The SARS-CoV-2 RNA is generally detectable in upper and lower  respiratory specimens dur ing the acute phase of infection.  Positive  results are indicative of active infection with SARS-CoV-2.  Clinical  correlation with patient history and other diagnostic information is  necessary to determine patient infection status.  Positive results do  not rule out bacterial infection or co-infection with other viruses. If result is PRESUMPTIVE POSTIVE SARS-CoV-2 nucleic acids MAY BE  PRESENT.   A presumptive positive result was obtained on the submitted specimen  and confirmed on repeat testing.  While 2019 novel coronavirus  (SARS-CoV-2) nucleic acids may be present in the submitted sample  additional confirmatory testing may be necessary for  epidemiological  and / or clinical management purposes  to differentiate between  SARS-CoV-2 and other Sarbecovirus currently known to infect humans.  If clinically indicated additional testing with an alternate test  methodology [id number]) is advised. The SARS-CoV-2 RNA is generally  detectable in upper and lower respiratory sp ecimens during the acute  phase of infection. The expected result is Negative. Fact Sheet for Patients:  StrictlyIdeas.no Fact Sheet for Healthcare Providers: BankingDealers.co.za This test is not yet approved or cleared by the [place] FDA and has been authorized for detection and/or diagnosis of SARS-CoV-2 by FDA under an Emergency Use Authorization (EUA).  This EUA will remain in effect (meaning this test can be used) for the duration of the COVID-19 declaration under Section 564(b)(1) of the Act, 21 U.S.C. section 360bbb-3(b)(1), unless the authorization is terminated or revoked sooner. Performed at [facility], [place] [address], [place], [place] [address]   Surgical pcr screen     Status: None   Collection Time: 12/30/18  9:48 PM   Specimen: Nasal Mucosa; Nasal Swab  Result Value Ref Range   MRSA, PCR NEGATIVE NEGATIVE   Staphylococcus aureus NEGATIVE NEGATIVE    Comment: (NOTE) The Xpert SA Assay (FDA approved for NASAL specimens in patients 61 years of age and older), is one component of a comprehensive surveillance program. It is not intended to diagnose infection nor to guide or monitor treatment. Performed at [facility], [place] [address], [place], [place] [address]    Ct Abdomen Pelvis W  Contrast  Result Date: 12/30/2018 CLINICAL DATA:  Abdominal pain with nausea and vomiting EXAM: CT ABDOMEN AND PELVIS WITH CONTRAST TECHNIQUE: Multidetector CT imaging of the abdomen and pelvis was performed using the standard protocol following bolus administration of intravenous contrast. CONTRAST:  19mL OMNIPAQUE IOHEXOL 350 MG/ML SOLN COMPARISON:  None. FINDINGS: Lower chest: Lung bases are clear. Hepatobiliary: There is hepatic steatosis. No focal liver lesions are evident. There is cholelithiasis. There is thickening of the gallbladder wall with apparent pericholecystic fluid. There is no appreciable biliary duct dilatation. Pancreas: There is no pancreatic mass or inflammatory focus. Spleen: No splenic lesions are evident. Adrenals/Urinary Tract: Adrenals bilaterally appear unremarkable. Kidneys bilaterally show no evident mass or hydronephrosis on either side. There is no evident renal or ureteral calculus on either side. Urinary bladder is midline with wall thickness within normal limits. Stomach/Bowel: There is no appreciable bowel wall or mesenteric thickening. No bowel obstruction is appreciable. Terminal ileum appears normal. There is no free air or portal venous air. Vascular/Lymphatic: There is no abdominal aortic aneurysm. No vascular lesions are evident. There is no adenopathy in the abdomen or pelvis. Reproductive: Uterus is anteverted. There is no evident pelvic mass. Tubal ligation clip is appreciated at the level of the left fallopian tube. A clip is noted inferior to the uterus on the left. No clip is noted in the right fallopian tube region. Other: Appendix appears normal. There is no abscess or ascites in the abdomen or pelvis. There is thinning of the midline rectus muscle. Musculoskeletal: There are no blastic or lytic bone lesions. There is spinal stenosis at L3-4 due to bony hypertrophy and disc protrusion. No intramuscular lesions are evident. IMPRESSION: 1. Apparent cholelithiasis  with gallbladder wall thickening and pericholecystic fluid. Suspect acute cholecystitis. Advise correlation with ultrasound of the gallbladder to further evaluate in this regard. 2. There is a clip at the level of the left fallopian tube. There is a clip inferior to the  uterus on the left; no clip is seen in the region of the right fallopian tube. Suspect right fallopian tube clip has become detached and migrated to the left inferiorly. 3. No bowel obstruction. No abscess in the abdomen or pelvis. The appendix appears normal. 4. No evident renal or ureteral calculus. No hydronephrosis. Urinary bladder wall thickness is normal. 5. Spinal stenosis at L3-4 due to bony hypertrophy and disc protrusion. 6.  Hepatic steatosis. Electronically Signed   By: Lowella Grip III M.D.   On: 12/30/2018 14:41    Review of Systems  Constitutional: Negative.   HENT: Negative.   Eyes: Negative.   Respiratory: Negative.   Cardiovascular: Negative.   Gastrointestinal: Positive for abdominal pain, nausea and vomiting.  Genitourinary: Negative.   Musculoskeletal: Negative.   Skin: Negative.   Neurological: Negative.   Endo/Heme/Allergies: Negative.   Psychiatric/Behavioral: Negative.     Blood pressure 137/83, pulse 72, temperature 98.2 F (36.8 C), temperature source Oral, resp. rate 16, height 5' 3.5" (1.613 m), weight 103.4 kg, last menstrual period 12/11/2012, SpO2 97 %. Physical Exam  Constitutional: She is oriented to person, place, and time. She appears well-developed and well-nourished. No distress.  HENT:  Head: Normocephalic and atraumatic.  Mouth/Throat: No oropharyngeal exudate.  Eyes: Pupils are equal, round, and reactive to light. Conjunctivae and EOM are normal.  Neck: Normal range of motion. Neck supple.  Cardiovascular: Normal rate, regular rhythm and normal heart sounds.  Respiratory: Effort normal and breath sounds normal. No stridor. No respiratory distress.  GI: Soft. Bowel sounds are  normal. There is abdominal tenderness.  Musculoskeletal: Normal range of motion.        General: No tenderness or edema.  Neurological: She is alert and oriented to person, place, and time. Coordination normal.  Skin: Skin is warm and dry. No rash noted.  Psychiatric: She has a normal mood and affect. Her behavior is normal. Thought content normal.     Assessment/Plan The patient appears to have cholecystitis with cholelithiasis. Because of the risk of further painful episodes and pancreatitis I think she would benefit from having gallbladder removed. I have discussed with her in detail the risks and benefits of the surgery as well as some of the technical aspects and she understands and wishes to proceed. Plan for lap chole with ioc tomorrow  [name], MD 12/31/2018, 6:54 AM

## 2019-01-01 ENCOUNTER — Encounter (HOSPITAL_COMMUNITY): Payer: Self-pay | Admitting: General Surgery

## 2019-01-01 MED ORDER — TRAMADOL HCL 50 MG PO TABS: 50.0000 mg | ORAL_TABLET | Freq: Four times a day (QID) | ORAL | 0 refills | Status: DC | PRN

## 2019-01-01 MED ORDER — ACETAMINOPHEN 325 MG PO TABS: 650.0000 mg | ORAL_TABLET | Freq: Four times a day (QID) | ORAL | Status: DC | PRN

## 2019-01-01 NOTE — Discharge Summary (Signed)
Physician Discharge Summary  Patient ID: Amanda Dudley MRN: [id number] DOB/AGE: 52-17-1967 52 y.o.  Admit date: 12/30/2018 Discharge date: 01/01/2019  Admission Diagnoses:  Patient Active Problem List   Diagnosis Date Noted  . Cholecystitis with cholelithiasis 12/30/2018     Discharge Diagnoses:  Active Problems:   Cholecystitis with cholelithiasis   Discharged Condition: good  Hospital Course: She underwent urgent cholecystectomy on 6/26. Post-operatively her pain is well controlled. She is tolerating PO. Her vitals have remained normal. She is deemed stable for discharge home.   Consults: None   Discharge Exam: Blood pressure 117/62, pulse 63, temperature 98 F (36.7 C), temperature source Oral, resp. rate 14, height 5' 3.5" (1.613 m), weight 103.4 kg, last menstrual period 12/11/2012, SpO2 98 %. General appearance: alert and cooperative Resp: unlabored Cardio: regular rate and rhythm GI: soft, appropriately tender. incisions c/d/i with dermabond Extremities: extremities normal, atraumatic, no cyanosis or edema Skin: Skin color, texture, turgor normal. No rashes or lesions Neurologic: Grossly normal Incision/Wound: c/d/i  Disposition: Discharge disposition: 01-Home or Self Care       Discharge Instructions    Increase activity slowly   Complete by: As directed      Allergies as of 01/01/2019      Reactions   Norco [hydrocodone-acetaminophen] Nausea And Vomiting   Makes pt feel loopy       Medication List    TAKE these medications   acetaminophen 325 MG tablet Commonly known as: TYLENOL Take 2 tablets (650 mg total) by mouth every 6 (six) hours as needed for mild pain, moderate pain or headache.   CALCARB 600 PO Take 600 mg by mouth daily.   calcium carbonate 500 MG chewable tablet Commonly known as: TUMS - dosed in mg elemental calcium Chew 2 tablets by mouth daily as needed for indigestion or heartburn.   cholecalciferol 25 MCG (1000 UT)  tablet Commonly known as: VITAMIN D3 Take 1,000 Units by mouth daily.   multivitamin with minerals tablet Take 1 tablet by mouth daily.   naproxen sodium 220 MG tablet Commonly known as: ALEVE Take 220-440 mg by mouth daily as needed (migraines).   traMADol 50 MG tablet Commonly known as: ULTRAM Take 1 tablet (50 mg total) by mouth every 6 (six) hours as needed for moderate pain.   Vyzulta 0.024 % Soln Generic drug: Latanoprostene Bunod Place 1 drop into both eyes at bedtime.      Follow-up Information    [facility], [place] Follow up in 3 week(s).   Specialty: General Surgery Contact information: [place] [address] [phone]           Signed: Clovis Riley 01/01/2019, 6:28 AM

## 2019-01-01 NOTE — Progress Notes (Addendum)
Discharge instructions reviewed with patient.  These included the following:  Prescriptions medications, follow-up office visits, when to call the MD, incision care, activity recommendations, dietary recommendations, etc.  Comprehension of instructions ensured utilizing "teach back" technique.  Incisions intact.  No drainage from sites noted.  Ate 75% of breakfast with no c/o nausea.

## 2019-01-01 NOTE — Discharge Instructions (Signed)
[facility] ______[facility] LAPAROSCOPIC SURGERY: POST OP INSTRUCTIONS Always review your discharge instruction sheet given to you by the facility where your surgery was performed. IF YOU HAVE DISABILITY OR FAMILY LEAVE FORMS, YOU MUST BRING THEM TO THE OFFICE FOR PROCESSING.   DO NOT GIVE THEM TO YOUR DOCTOR.  1. A prescription for pain medication may be given to you upon discharge.  Take your pain medication as prescribed, if needed.  If narcotic pain medicine is not needed, then you may take acetaminophen (Tylenol) or ibuprofen (Advil) as needed. 2. Take your usually prescribed medications unless otherwise directed. 3. If you need a refill on your pain medication, please contact your pharmacy.  They will contact our office to request authorization. Prescriptions will not be filled after 5pm or on week-ends. 4. You should follow a light diet the first few days after arrival home, such as soup and crackers, etc.  Be sure to include lots of fluids daily. 5. Most patients will experience some swelling and bruising in the area of the incisions.  Ice packs will help.  Swelling and bruising can take several days to resolve.  6. It is common to experience some constipation if taking pain medication after surgery.  Increasing fluid intake and taking a stool softener (such as Colace) will usually help or prevent this problem from occurring.  A mild laxative (Milk of Magnesia or Miralax) should be taken according to package instructions if there are no bowel movements after 48 hours. 7. Unless discharge instructions indicate otherwise, you may remove your bandages 24-48 hours after surgery, and you may shower at that time.  You may have steri-strips (small skin tapes) in place directly over the incision.  These strips should be left on the skin for 7-10 days.  If your surgeon used skin glue on the incision, you may shower in 24 hours.  The glue will flake off over the next 2-3 weeks.  Any sutures or  staples will be removed at the office during your follow-up visit. 8. ACTIVITIES:  You may resume regular (light) daily activities beginning the next day--such as daily self-care, walking, climbing stairs--gradually increasing activities as tolerated.  You may have sexual intercourse when it is comfortable.  Refrain from any heavy lifting or straining until approved by your doctor. a. You may drive when you are no longer taking prescription pain medication, you can comfortably wear a seatbelt, and you can safely maneuver your car and apply brakes. b. RETURN TO WORK:  ____1-2 weeks__________________________________________________ 9. You should see your doctor in the office for a follow-up appointment approximately 2-3 weeks after your surgery.  Make sure that you call for this appointment within a day or two after you arrive home to insure a convenient appointment time. 10. OTHER INSTRUCTIONS: __________________________________________________________________________________________________________________________ __________________________________________________________________________________________________________________________ WHEN TO CALL YOUR DOCTOR: 1. Fever over 101.0 2. Inability to urinate 3. Continued bleeding from incision. 4. Increased pain, redness, or drainage from the incision. 5. Increasing abdominal pain  The clinic staff is available to answer your questions during regular business hours.  Please dont hesitate to call and ask to speak to one of the nurses for clinical concerns.  If you have a medical emergency, go to the nearest emergency room or call 911.  A surgeon from [facility] is always on call at the hospital. [address], [place], [place], [place]  [address] ? [address], [place], [place]   [address] [phone] ? [phone] ? FAX [phone] Web  site: [email or web address] ° °

## 2019-12-19 ENCOUNTER — Other Ambulatory Visit: Payer: Self-pay | Admitting: Obstetrics and Gynecology

## 2019-12-19 DIAGNOSIS — Z1231 Encounter for screening mammogram for malignant neoplasm of breast: Secondary | ICD-10-CM

## 2019-12-28 ENCOUNTER — Ambulatory Visit
Admission: RE | Admit: 2019-12-28 | Discharge: 2019-12-28 | Disposition: A | Discharge status: Home / Self Care | Payer: BC Managed Care – PPO | Source: Ambulatory Visit | Attending: Obstetrics and Gynecology | Admitting: Obstetrics and Gynecology

## 2019-12-28 ENCOUNTER — Other Ambulatory Visit: Payer: Self-pay

## 2019-12-28 DIAGNOSIS — Z1231 Encounter for screening mammogram for malignant neoplasm of breast: Secondary | ICD-10-CM

## 2021-06-17 ENCOUNTER — Other Ambulatory Visit: Payer: Self-pay | Admitting: Obstetrics and Gynecology

## 2021-06-17 DIAGNOSIS — Z1231 Encounter for screening mammogram for malignant neoplasm of breast: Secondary | ICD-10-CM

## 2021-07-19 ENCOUNTER — Ambulatory Visit
Admission: RE | Admit: 2021-07-19 | Discharge: 2021-07-19 | Disposition: A | Discharge status: Home / Self Care | Payer: BC Managed Care – PPO | Source: Ambulatory Visit | Attending: Obstetrics and Gynecology | Admitting: Obstetrics and Gynecology

## 2021-07-19 DIAGNOSIS — Z1231 Encounter for screening mammogram for malignant neoplasm of breast: Secondary | ICD-10-CM

## 2021-12-24 ENCOUNTER — Ambulatory Visit: Payer: BC Managed Care – PPO | Admitting: Physician Assistant

## 2021-12-24 ENCOUNTER — Encounter: Payer: Self-pay | Admitting: Physician Assistant

## 2021-12-24 DIAGNOSIS — D485 Neoplasm of uncertain behavior of skin: Secondary | ICD-10-CM | POA: Diagnosis not present

## 2021-12-26 ENCOUNTER — Encounter: Payer: Self-pay | Admitting: Physician Assistant

## 2021-12-26 NOTE — Progress Notes (Signed)
   New Patient   Subjective  Amanda Dudley is a 56 y.o. female who presents for the following: New Patient (Initial Visit) (Patient here today for lesion on her right upper arm x few years per patient it's on her small pox scar. Per patient there has been bleeding, no pain. Patient would also like to know what she can do for discoloration on her arms x few years no treatment. No personal history or family history of atypical moles, melanoma or non mole skin cancer. ).   The following portions of the chart were reviewed this encounter and updated as appropriate:  Tobacco  Allergies  Meds  Problems  Med Hx  Surg Hx  Fam Hx      Objective  Well appearing patient in no apparent distress; mood and affect are within normal limits.  All skin waist up examined.  right deltoid Deep pink scaly plaque   Assessment & Plan  Neoplasm of uncertain behavior of skin right deltoid  Biopsy deferred- patient going on cruse- will come back for bx      I, Kaian Fahs, PA-C, have reviewed all documentation's for this visit.  The documentation on 12/26/21 for the exam, diagnosis, procedures and orders are all accurate and complete.

## 2022-04-29 ENCOUNTER — Ambulatory Visit: Payer: BC Managed Care – PPO | Admitting: Physician Assistant

## 2023-02-05 ENCOUNTER — Other Ambulatory Visit: Payer: Self-pay | Admitting: Obstetrics and Gynecology

## 2023-02-05 DIAGNOSIS — Z1231 Encounter for screening mammogram for malignant neoplasm of breast: Secondary | ICD-10-CM

## 2023-02-11 ENCOUNTER — Ambulatory Visit
Admission: RE | Admit: 2023-02-11 | Discharge: 2023-02-11 | Disposition: A | Discharge status: Home / Self Care | Payer: BC Managed Care – PPO | Source: Ambulatory Visit | Attending: Obstetrics and Gynecology | Admitting: Obstetrics and Gynecology

## 2023-02-11 DIAGNOSIS — Z1231 Encounter for screening mammogram for malignant neoplasm of breast: Secondary | ICD-10-CM

## 2023-09-24 ENCOUNTER — Ambulatory Visit: Admitting: Podiatry

## 2023-09-24 ENCOUNTER — Encounter: Payer: Self-pay | Admitting: Podiatry

## 2023-09-24 ENCOUNTER — Ambulatory Visit (INDEPENDENT_AMBULATORY_CARE_PROVIDER_SITE_OTHER)

## 2023-09-24 DIAGNOSIS — S90851A Superficial foreign body, right foot, initial encounter: Secondary | ICD-10-CM | POA: Diagnosis not present

## 2023-09-24 DIAGNOSIS — M79672 Pain in left foot: Secondary | ICD-10-CM | POA: Diagnosis not present

## 2023-09-24 NOTE — Patient Instructions (Signed)
 Place 1/4 cup of epsom salts in a quart of warm tap water.  Submerge your foot or feet in the solution and soak for 20 minutes.  This soak should be done twice a day.  Next, remove your foot or feet from solution, blot dry the affected area. Apply ointment and cover if instructed by your doctor.   IF YOUR SKIN BECOMES IRRITATED WHILE USING THESE INSTRUCTIONS, IT IS OKAY TO SWITCH TO  WHITE VINEGAR AND WATER.  As another alternative soak, you may use antibacterial soap and water.  Monitor for any signs/symptoms of infection. Call the office immediately if any occur or go directly to the emergency room. Call with any questions/concerns.  More silicone and felt pads can be purchased from:  [email or web address]  Look for callus pads or horseshoe pads  If you get some recurrent callus, you can also scrub it with white vinegar using a toothbrush before showering and this will help soften the area up.  This make it easier for you to file the callus tissue down using emery board or pumice stone.

## 2023-09-24 NOTE — Progress Notes (Unsigned)
 Right foot plantar first interspace with sesamoid complex hyperkeratotic lesion with possible foreign body and core, removed small piece of what appeared to be a splinter like material. Did inject 2 cc's of local and used combination of 15 blade and dermal curette.  Skin prepped with alcohol.  Bacitracin and Band-Aid applied.  Can bandage using Neosporin for the first 5 to 7 days.  Complaining of some symptoms as well.  Follow-up in 2 weeks

## 2023-10-15 ENCOUNTER — Ambulatory Visit: Admitting: Podiatry

## 2023-10-15 ENCOUNTER — Encounter: Payer: Self-pay | Admitting: Podiatry

## 2023-10-15 VITALS — Ht 63.5 in | Wt 227.9 lb

## 2023-10-15 DIAGNOSIS — L84 Corns and callosities: Secondary | ICD-10-CM | POA: Diagnosis not present

## 2023-10-15 DIAGNOSIS — S90851A Superficial foreign body, right foot, initial encounter: Secondary | ICD-10-CM

## 2023-10-15 MED ORDER — SALICYLIC ACID 17 % EX SOLN: Freq: Every day | CUTANEOUS | 0 refills | Status: AC

## 2023-10-15 NOTE — Progress Notes (Signed)
       Chief Complaint  Patient presents with   Foreign Body    " I hope its healing and nothing still in the foot as there is a callous there"    HPI: 58 y.o. female presents following up for possible foreign body right plantar forefoot.  She denies any pain today.  She feels that she is doing a bit better.  She has had some recurrence of the callus.  Past Medical History:  Diagnosis Date   Migraine     Past Surgical History:  Procedure Laterality Date   BREAST EXCISIONAL BIOPSY Left    benign   BUNIONECTOMY     CESAREAN SECTION     CHOLECYSTECTOMY N/A 12/31/2018   Procedure: LAPAROSCOPIC CHOLECYSTECTOMY WITH INTRAOPERATIVE CHOLANGIOGRAM;  Surgeon: Caralyn Chandler, MD;  Location: [facility];  Service: General;  Laterality: N/A;   TUBAL LIGATION      Allergies  Allergen Reactions   Norco [Hydrocodone-Acetaminophen] Nausea And Vomiting    Makes pt feel loopy     ROS    Physical Exam: There were no vitals filed for this visit.  General: The patient is alert and oriented x3 in no acute distress.  Dermatology: Skin is warm, dry and supple bilateral lower extremities. Interspaces are clear of maceration and debris.  Ball of the right plantar foot just distal and lateral to to the sesamoid complex there is lesion mild recurrent hyperkeratotic buildup, nontender.  Lesion pared down, no underlying ulceration, no encountered foreign body noted.  Vascular: Palpable pedal pulses bilaterally. Capillary refill within normal limits.  No appreciable edema.  No erythema or calor.  Neurological: Light touch sensation grossly intact bilateral feet.   Musculoskeletal Exam: No pedal deformities noted  Radiographic Exam: Right foot 09/25/2023 Normal osseous mineralization. Joint spaces preserved.  No fractures or osseous irregularities noted.  No obvious radiodense foreign body noted for your post attempting removal.  Assessment/Plan of Care: 1. Foreign body of skin of plantar aspect of right  foot   2. Callus of foot      Meds ordered this encounter  Medications   salicylic acid-lactic acid 17 % external solution    Sig: Apply topically daily.    Dispense:  14 mL    Refill:  0   None  Discussed clinical findings with patient today.  Hyperkeratotic lesion right forefoot sharply debrided using 302 scalpel blade without incident today.  Debrided as a courtesy.  Salinocaine cream applied.  Rx sent in for 17% salicylic acid to be applied to the lesion daily in an effort to get this to fully resolve.  She can soak the foot daily with warm Epsom salts as well.  Follow-up if symptoms recur or worsen.   Jilliana Burkes L. Lunda Salines, AACFAS [facility]     [address] [place], [place] [address]                Office [phone]  Fax [phone]

## 2023-10-15 NOTE — Patient Instructions (Addendum)
 Soak your foot twice a day in warm Epsom salts for 15 to 20 minutes.  Apply a small drop of salicylic acid to the lesion once a day and keep covered with a Band-Aid. If a blister starts to form, discontinue the salicylic acid and treat with Neosporin until the blister resolves.

## 2023-10-18 ENCOUNTER — Encounter: Payer: Self-pay | Admitting: Podiatry

## 2023-12-26 IMAGING — MG MM DIGITAL SCREENING BILAT W/ TOMO AND CAD
8 series · 8 of 24 positions shown · non-contrast
Comparison: Previous exam(s).

CLINICAL DATA: Screening.

EXAM:
DIGITAL SCREENING BILATERAL MAMMOGRAM WITH TOMOSYNTHESIS AND CAD
TECHNIQUE: Bilateral screening digital craniocaudal and mediolateral oblique
mammograms were obtained. Bilateral screening digital breast
tomosynthesis was performed. The images were evaluated with
computer-aided detection.

[L CC synth-2D]
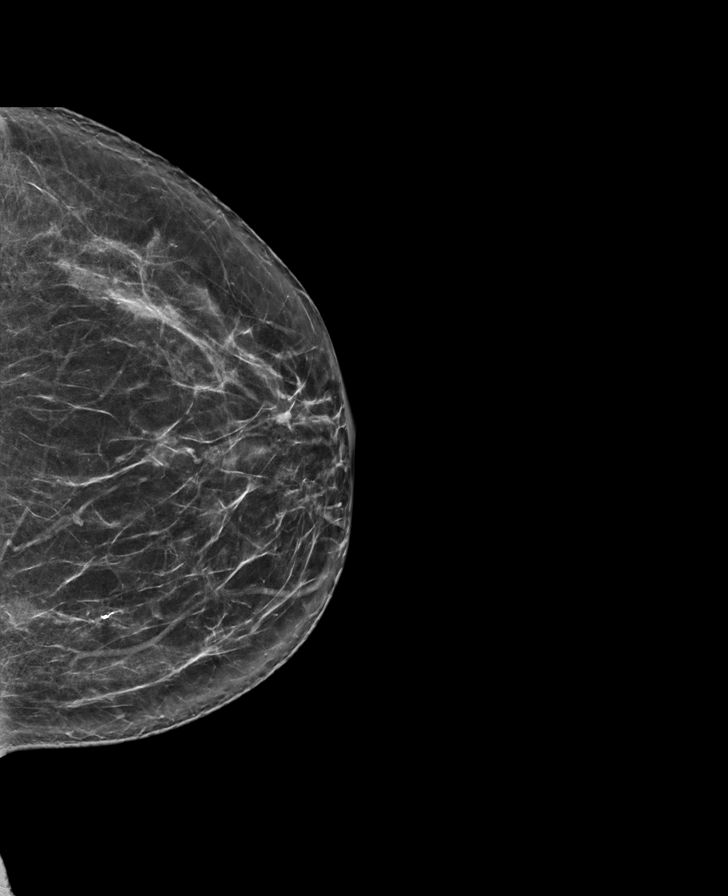

[L MLO synth-2D]
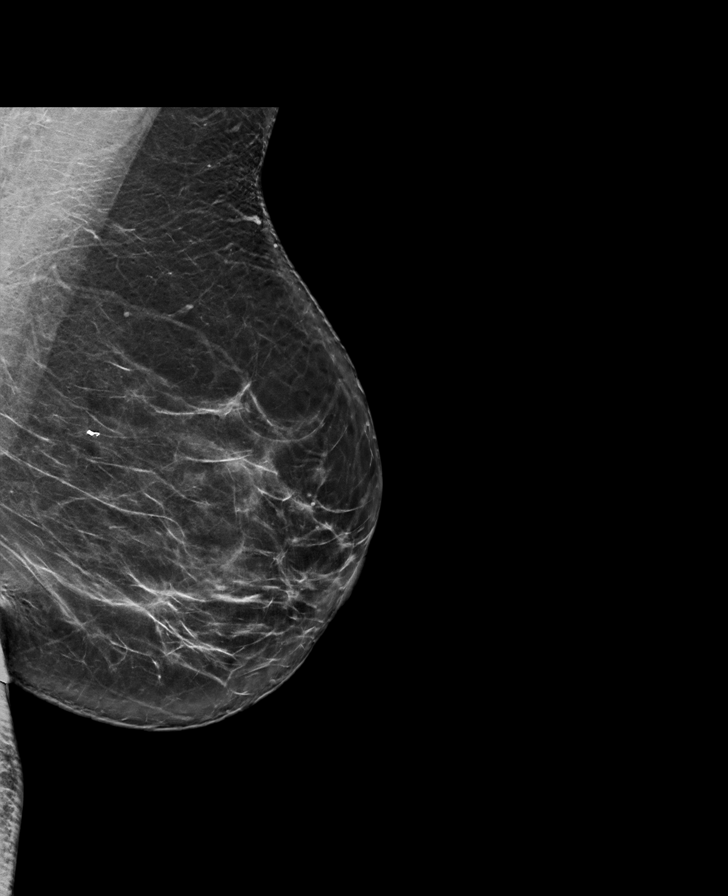

[R MLO synth-2D]
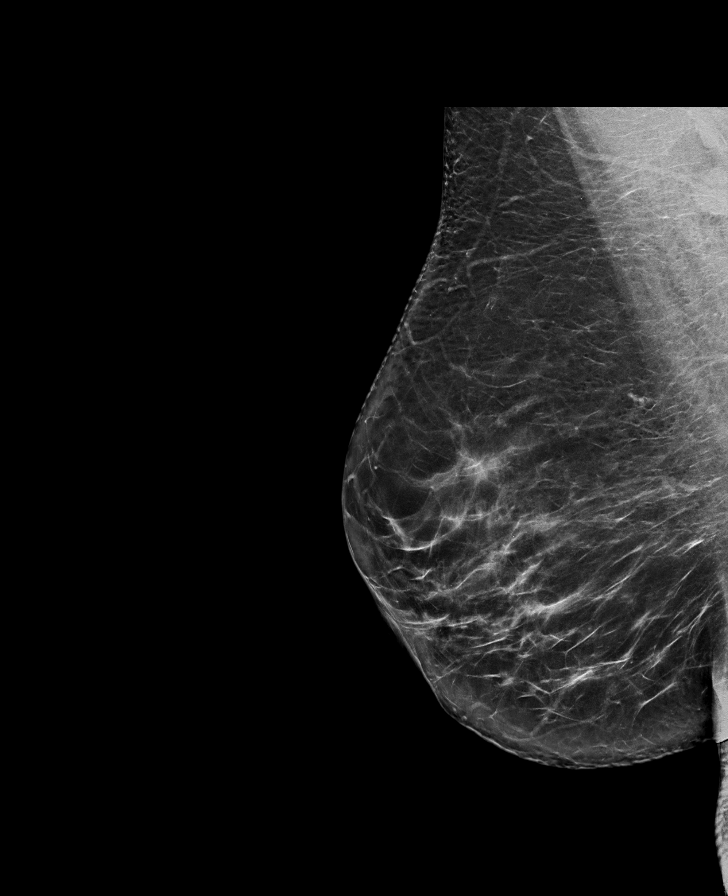

[R CC synth-2D]
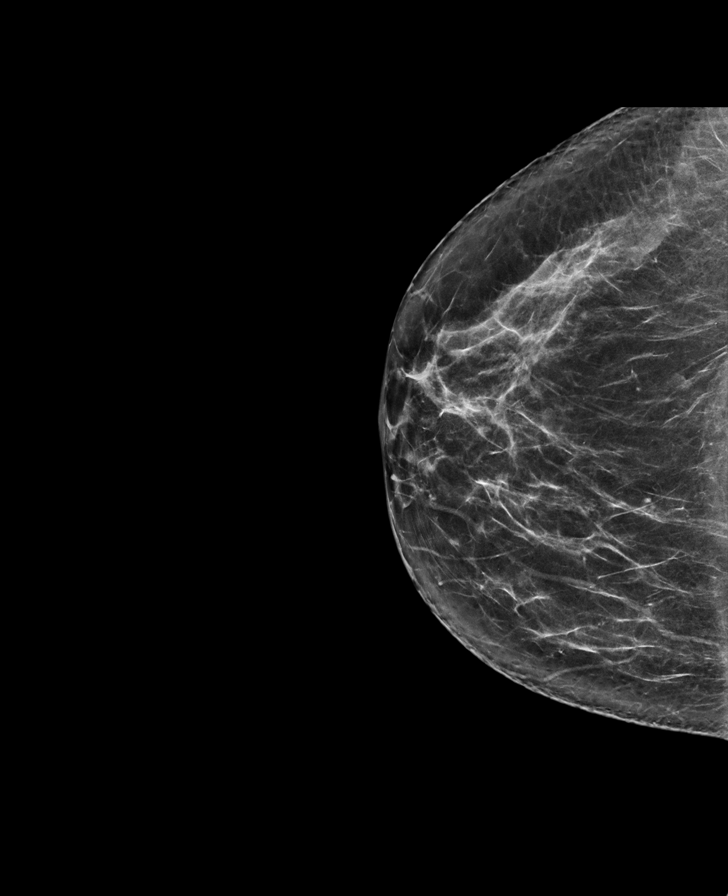

[L MLO tomo · tomo slice 43/84.0]
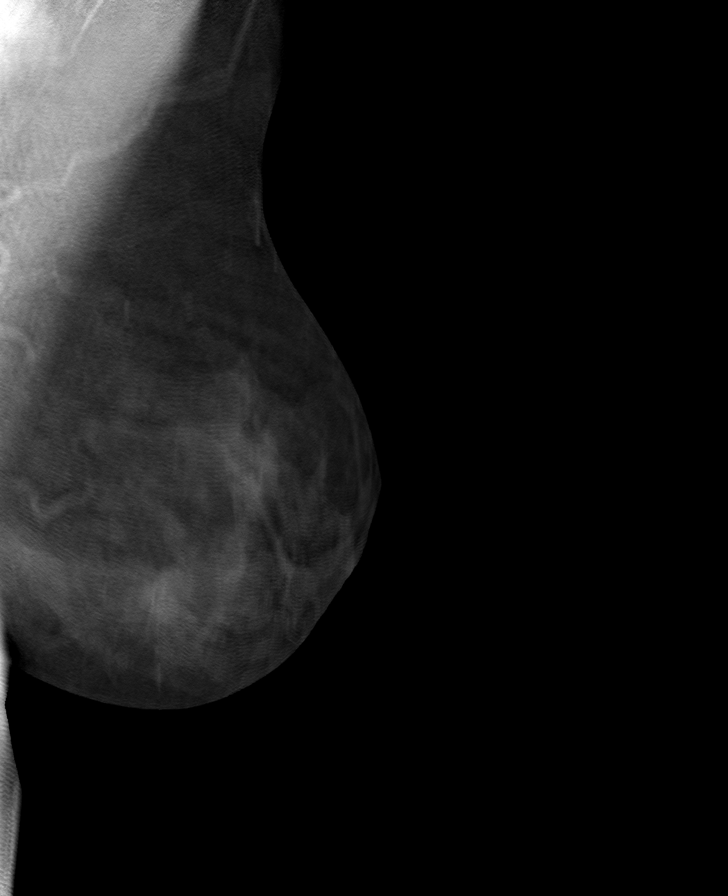

[R MLO tomo · tomo slice 43/84.0]
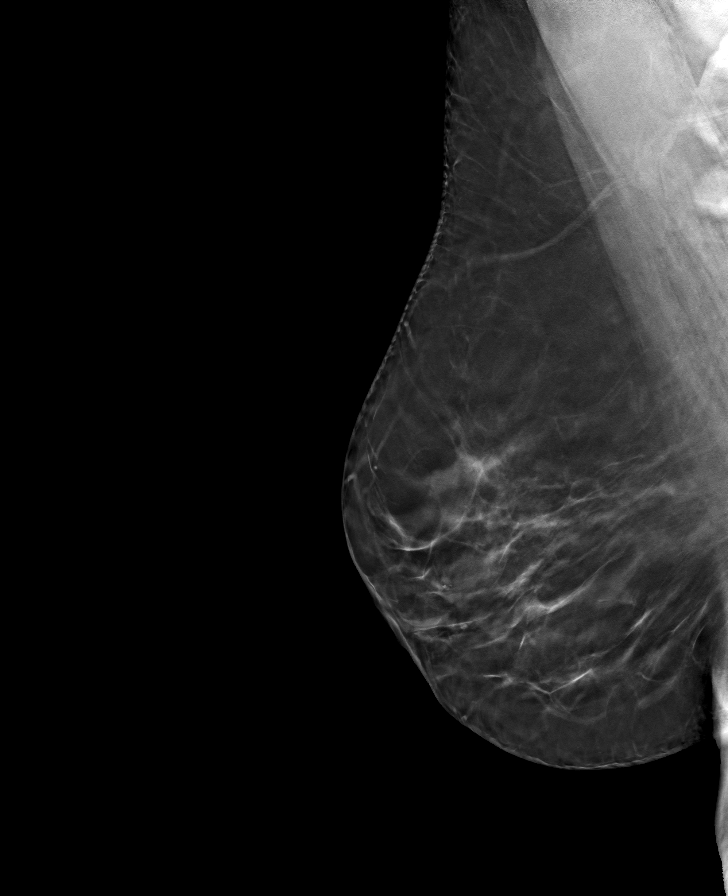

[L CC tomo · tomo slice 35/70.0]
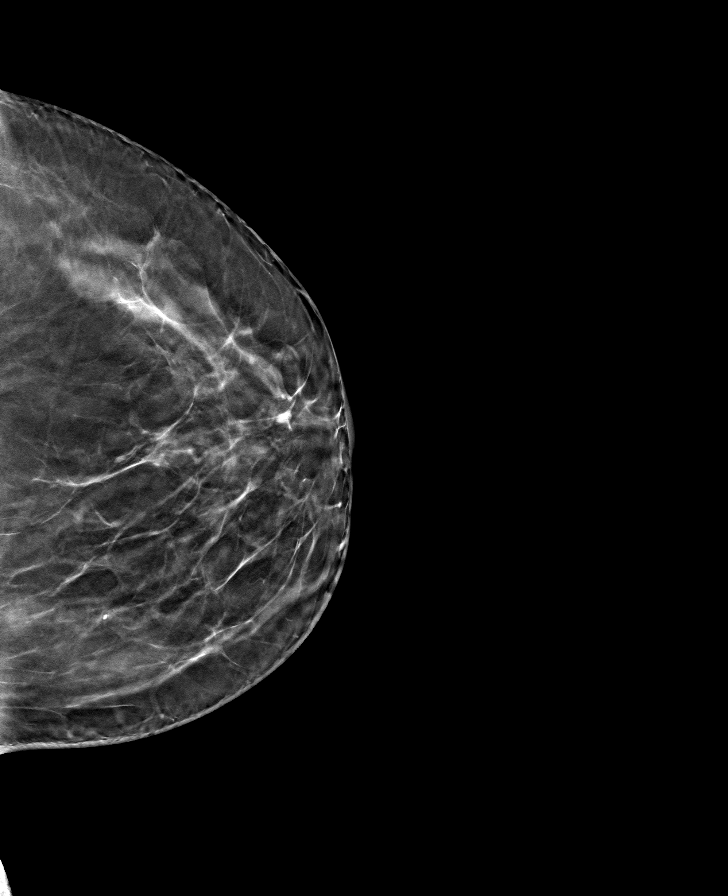

[R CC tomo · tomo slice 37/74.0]
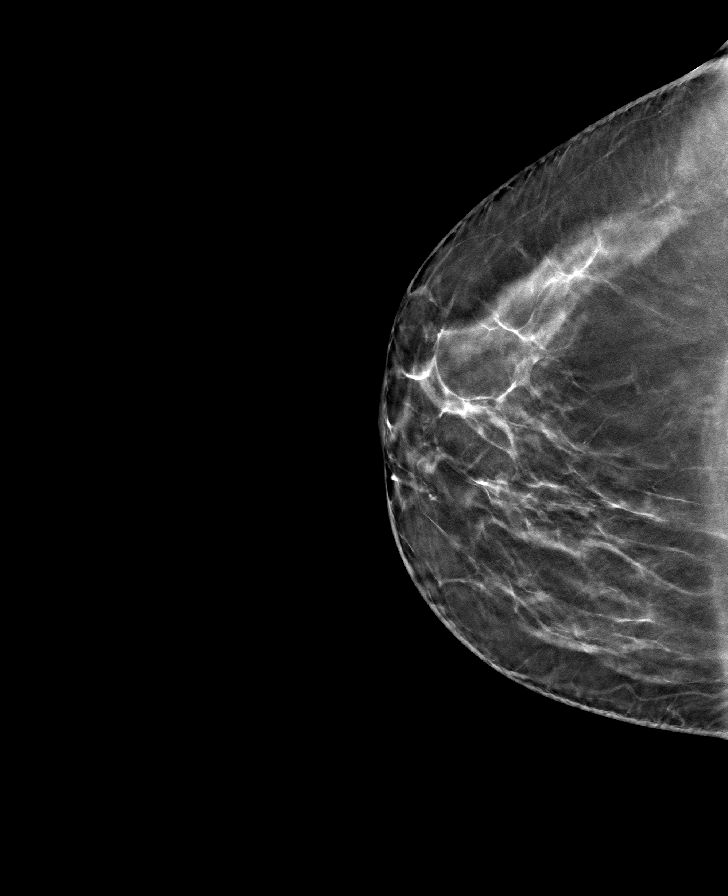

[8 of 24 positions shown; findings below may reference images not displayed]

ACR Breast Density Category b: There are scattered areas of
fibroglandular density.
FINDINGS: There are no findings suspicious for malignancy.
IMPRESSION: No mammographic evidence of malignancy. A result letter of this
screening mammogram will be mailed directly to the patient.

RECOMMENDATION:
Screening mammogram in one year. (Code:51-O-LD2)

BI-RADS CATEGORY  1: Negative.

## 2024-04-11 ENCOUNTER — Other Ambulatory Visit: Payer: Self-pay | Admitting: Obstetrics and Gynecology

## 2024-04-11 DIAGNOSIS — Z1231 Encounter for screening mammogram for malignant neoplasm of breast: Secondary | ICD-10-CM

## 2024-04-22 ENCOUNTER — Ambulatory Visit
Admission: RE | Admit: 2024-04-22 | Discharge: 2024-04-22 | Disposition: A | Discharge status: Home / Self Care | Source: Ambulatory Visit | Attending: Obstetrics and Gynecology | Admitting: Obstetrics and Gynecology

## 2024-04-22 DIAGNOSIS — Z1231 Encounter for screening mammogram for malignant neoplasm of breast: Secondary | ICD-10-CM
